# Patient Record
Sex: Female | Born: 1975 | Hispanic: Yes | Marital: Single
Health system: Midwestern US, Academic
[De-identification: ages and names within clinical notes are randomized; demographics above are authoritative.]

## PROBLEM LIST (undated history)

## (undated) ENCOUNTER — Inpatient Hospital Stay (HOSPITAL_COMMUNITY): Payer: Self-pay

## (undated) DIAGNOSIS — A150 Tuberculosis of lung: Secondary | ICD-10-CM

## (undated) DIAGNOSIS — O4403 Placenta previa specified as without hemorrhage, third trimester: ICD-10-CM

## (undated) HISTORY — PX: FACIAL COSMETIC SURGERY: SHX629

---

## 2005-02-01 ENCOUNTER — Emergency Department (HOSPITAL_COMMUNITY): Admission: EM | Admit: 2005-02-01 | Discharge: 2005-02-02 | Payer: Self-pay | Admitting: Emergency Medicine

## 2005-02-03 ENCOUNTER — Emergency Department (HOSPITAL_COMMUNITY): Admission: EM | Admit: 2005-02-03 | Discharge: 2005-02-03 | Payer: Self-pay | Admitting: Emergency Medicine

## 2006-02-11 ENCOUNTER — Emergency Department (HOSPITAL_COMMUNITY): Admission: EM | Admit: 2006-02-11 | Discharge: 2006-02-11 | Payer: Self-pay | Admitting: Emergency Medicine

## 2006-04-03 ENCOUNTER — Emergency Department (HOSPITAL_COMMUNITY): Admission: EM | Admit: 2006-04-03 | Discharge: 2006-04-03 | Payer: Self-pay | Admitting: Emergency Medicine

## 2006-11-30 ENCOUNTER — Inpatient Hospital Stay (HOSPITAL_COMMUNITY): Admission: AD | Admit: 2006-11-30 | Discharge: 2006-12-01 | Payer: Self-pay | Admitting: Obstetrics & Gynecology

## 2006-12-06 ENCOUNTER — Inpatient Hospital Stay (HOSPITAL_COMMUNITY): Admission: AD | Admit: 2006-12-06 | Discharge: 2006-12-06 | Payer: Self-pay | Admitting: Obstetrics and Gynecology

## 2010-03-14 ENCOUNTER — Emergency Department (HOSPITAL_COMMUNITY): Admission: EM | Admit: 2010-03-14 | Discharge: 2010-03-14 | Payer: Self-pay | Admitting: Emergency Medicine

## 2010-06-28 ENCOUNTER — Inpatient Hospital Stay (HOSPITAL_COMMUNITY): Admission: AD | Admit: 2010-06-28 | Discharge: 2010-06-28 | Payer: Self-pay | Admitting: Family Medicine

## 2010-06-28 ENCOUNTER — Ambulatory Visit: Payer: Self-pay | Admitting: Nurse Practitioner

## 2010-07-23 ENCOUNTER — Inpatient Hospital Stay (HOSPITAL_COMMUNITY): Admission: AD | Admit: 2010-07-23 | Discharge: 2010-07-23 | Payer: Self-pay | Admitting: Obstetrics & Gynecology

## 2010-08-08 ENCOUNTER — Inpatient Hospital Stay (HOSPITAL_COMMUNITY): Admission: AD | Admit: 2010-08-08 | Discharge: 2010-08-08 | Payer: Self-pay | Admitting: Obstetrics & Gynecology

## 2010-08-21 ENCOUNTER — Ambulatory Visit: Payer: Self-pay | Admitting: Obstetrics and Gynecology

## 2010-08-21 ENCOUNTER — Inpatient Hospital Stay (HOSPITAL_COMMUNITY): Admission: AD | Admit: 2010-08-21 | Discharge: 2010-08-21 | Payer: Self-pay | Admitting: Obstetrics and Gynecology

## 2010-09-10 ENCOUNTER — Inpatient Hospital Stay (HOSPITAL_COMMUNITY)
Admission: AD | Admit: 2010-09-10 | Discharge: 2010-09-10 | Payer: Self-pay | Source: Home / Self Care | Admitting: Obstetrics and Gynecology

## 2010-10-09 ENCOUNTER — Ambulatory Visit (HOSPITAL_COMMUNITY)
Admission: RE | Admit: 2010-10-09 | Discharge: 2010-10-09 | Payer: Self-pay | Source: Home / Self Care | Attending: Family Medicine | Admitting: Family Medicine

## 2010-11-13 ENCOUNTER — Ambulatory Visit
Admission: RE | Admit: 2010-11-13 | Discharge: 2010-11-13 | Payer: Self-pay | Source: Home / Self Care | Attending: Obstetrics and Gynecology | Admitting: Obstetrics and Gynecology

## 2010-11-13 LAB — POCT URINALYSIS DIPSTICK
Ketones, ur: NEGATIVE mg/dL
Specific Gravity, Urine: 1.02 (ref 1.005–1.030)
Urine Glucose, Fasting: NEGATIVE mg/dL
Urobilinogen, UA: 0.2 mg/dL (ref 0.0–1.0)

## 2010-11-27 ENCOUNTER — Other Ambulatory Visit: Payer: Self-pay

## 2010-11-27 ENCOUNTER — Encounter: Payer: Self-pay | Admitting: Family Medicine

## 2010-11-27 DIAGNOSIS — O09299 Supervision of pregnancy with other poor reproductive or obstetric history, unspecified trimester: Secondary | ICD-10-CM

## 2010-11-27 LAB — CONVERTED CEMR LAB
MCV: 86.9 fL (ref 78.0–100.0)
Platelets: 206 10*3/uL (ref 150–400)
RBC: 4.05 M/uL (ref 3.87–5.11)
WBC: 8 10*3/uL (ref 4.0–10.5)

## 2010-12-04 ENCOUNTER — Ambulatory Visit (HOSPITAL_COMMUNITY): Payer: Self-pay

## 2010-12-04 ENCOUNTER — Inpatient Hospital Stay (HOSPITAL_COMMUNITY): Payer: Medicaid Other

## 2010-12-04 ENCOUNTER — Inpatient Hospital Stay (HOSPITAL_COMMUNITY)
Admission: AD | Admit: 2010-12-04 | Discharge: 2010-12-06 | DRG: 778 | Disposition: A | Discharge status: Home / Self Care | Payer: Medicaid Other | Source: Ambulatory Visit | Attending: Obstetrics and Gynecology | Admitting: Obstetrics and Gynecology

## 2010-12-04 DIAGNOSIS — O4700 False labor before 37 completed weeks of gestation, unspecified trimester: Secondary | ICD-10-CM

## 2010-12-04 DIAGNOSIS — O26879 Cervical shortening, unspecified trimester: Secondary | ICD-10-CM | POA: Diagnosis present

## 2010-12-04 LAB — URINALYSIS, ROUTINE W REFLEX MICROSCOPIC
Bilirubin Urine: NEGATIVE
Ketones, ur: 15 mg/dL — AB
Nitrite: NEGATIVE
Protein, ur: NEGATIVE mg/dL
Specific Gravity, Urine: 1.025 (ref 1.005–1.030)
Urobilinogen, UA: 0.2 mg/dL (ref 0.0–1.0)

## 2010-12-04 LAB — WET PREP, GENITAL
Clue Cells Wet Prep HPF POC: NONE SEEN
Trich, Wet Prep: NONE SEEN

## 2010-12-04 LAB — URINE MICROSCOPIC-ADD ON

## 2010-12-05 ENCOUNTER — Encounter (HOSPITAL_COMMUNITY): Payer: Self-pay

## 2010-12-05 DIAGNOSIS — O4700 False labor before 37 completed weeks of gestation, unspecified trimester: Secondary | ICD-10-CM

## 2010-12-05 DIAGNOSIS — O26879 Cervical shortening, unspecified trimester: Secondary | ICD-10-CM

## 2010-12-05 LAB — CBC
HCT: 34.4 % — ABNORMAL LOW (ref 36.0–46.0)
Hemoglobin: 11.5 g/dL — ABNORMAL LOW (ref 12.0–15.0)
MCHC: 33.4 g/dL (ref 30.0–36.0)
RBC: 4 MIL/uL (ref 3.87–5.11)
WBC: 9 10*3/uL (ref 4.0–10.5)

## 2010-12-05 LAB — GC/CHLAMYDIA PROBE AMP, URINE
Chlamydia, Swab/Urine, PCR: NEGATIVE
GC Probe Amp, Urine: NEGATIVE

## 2010-12-05 LAB — FETAL FIBRONECTIN: Fetal Fibronectin: POSITIVE — AB

## 2010-12-06 DIAGNOSIS — O26879 Cervical shortening, unspecified trimester: Secondary | ICD-10-CM

## 2010-12-06 DIAGNOSIS — O4700 False labor before 37 completed weeks of gestation, unspecified trimester: Secondary | ICD-10-CM

## 2010-12-06 LAB — STREP B DNA PROBE: Strep Group B Ag: POSITIVE

## 2010-12-11 ENCOUNTER — Other Ambulatory Visit: Payer: Self-pay

## 2010-12-11 DIAGNOSIS — O0930 Supervision of pregnancy with insufficient antenatal care, unspecified trimester: Secondary | ICD-10-CM

## 2010-12-11 LAB — POCT URINALYSIS DIPSTICK
Protein, ur: NEGATIVE mg/dL
Urobilinogen, UA: 1 mg/dL (ref 0.0–1.0)
pH: 7 (ref 5.0–8.0)

## 2010-12-16 NOTE — Discharge Summary (Signed)
[name], [name]        ACCOUNT NO.:  [id number]  MEDICAL RECORD NO.:  [id number]           PATIENT TYPE:  I  LOCATION:  9159                          FACILITY:  [facility]  PHYSICIAN:  Horton Chin, MD DATE OF BIRTH:  04/18/1976  DATE OF ADMISSION:  12/04/2010 DATE OF DISCHARGE:  12/06/2010                              DISCHARGE SUMMARY   ADMISSION DIAGNOSIS:  Preterm labor at 29-2/7 weeks.  DISCHARGE DIAGNOSIS:  Preterm labor at 29-4/7 weeks.  CONSULTS:  Neonatology.  PERTINENT STUDIES:  On December 05, 2010, the patient had an limited OB ultrasound to assess cervical length which showed cervical length of 1 cm and a funnel width of 2.4 cm.  Fetus was known to be in a cephalic presentation with good cardiac activity at 138.  LABORATORY EVALUATION:  Showed a white blood cell count of 9, hemoglobin of 11.5, hematocrit 34.4, and platelet count of 180,000.  A fetal fibronectin that was done on admission was positive.  Urinalysis was negative for infection.  A wet prep was negative.  Gonorrhea, Chlamydia probe was also negative.  GBS was pending at the time of discharge.  The patient is also O+, antibody negative, hepatitis B surface antigen negative, rubella immune, HIV negative, RPR nonreactive.  Her due date is noted to be Feb 19, 2011 with a last menstrual period of May 13, 2010; however, on evaluation of her hollisters, her LMP was May 15, 2010 giving her [facility] as Feb 19, 2011.  That will make her 29 and 2 at the time of discharge.  BRIEF HOSPITAL COURSE:  The patient is a 35 year old gravida 6, para 3-1- 1-3 who was admitted for concern of preterm labor.  The patient was noted to be 1-2 cm, 50% effaced, -2 station and vertex.  She also had a positive fetal fibronectin of cervical length.  Ultrasound was done which showed 1 cm shortened cervix with 2.4 cm funnel length.  Given this concern, the patient was admitted to the antenatal unit.  She was given  betamethasone and also started on magnesium sulfate for tocolysis and also cerebral palsy protection.  The patient was on this medication for 12 hours at which point it was discontinued.  She did not require any further tocolysis as she did not have any contractions.  The patient was also started on Prometrium 200 mg per vagina at bedtime given her shortened cervix.  The patient was observed for 48 hours.  She had no further change in her cervical examination.  The patient's fetal heart rate tracing remained reactive and she had no other concerning symptoms. The patient was deemed stable for discharge to home.  Discharge medications included Prometrium 200 mg per vagina at bedtime. The patient is to continue her prenatal vitamins.  DISCHARGE INSTRUCTIONS:  The patient was given routine prenatal discharge instructions including preterm labor and fetal movement precautions.  She was told to be on bed rest with bathroom privileges. The patient will follow up in clinic for her already scheduled  appointment on February 23.  She was strongly advised to call the clinic or come back into the MAU if she has any concerning symptoms.  Of note, all the counseling was done with the help of a Spanish  interpreter.     Horton Chin, MD     UAA/MEDQ  D:  12/06/2010  T:  12/06/2010  Job:  [id number]  Electronically Signed by Jaynie Collins MD on 12/16/2010 06:19:09 PM

## 2010-12-18 ENCOUNTER — Other Ambulatory Visit: Payer: Self-pay

## 2010-12-18 DIAGNOSIS — O4700 False labor before 37 completed weeks of gestation, unspecified trimester: Secondary | ICD-10-CM

## 2010-12-18 LAB — POCT URINALYSIS DIPSTICK
Nitrite: NEGATIVE
Protein, ur: NEGATIVE mg/dL
Specific Gravity, Urine: >=1.03 (ref 1.005–1.030)
Urine Glucose, Fasting: NEGATIVE mg/dL
Urobilinogen, UA: 0.2 mg/dL (ref 0.0–1.0)

## 2010-12-25 DIAGNOSIS — O09299 Supervision of pregnancy with other poor reproductive or obstetric history, unspecified trimester: Secondary | ICD-10-CM

## 2010-12-25 DIAGNOSIS — O34219 Maternal care for unspecified type scar from previous cesarean delivery: Secondary | ICD-10-CM

## 2010-12-25 DIAGNOSIS — O99340 Other mental disorders complicating pregnancy, unspecified trimester: Secondary | ICD-10-CM

## 2010-12-25 DIAGNOSIS — O0930 Supervision of pregnancy with insufficient antenatal care, unspecified trimester: Secondary | ICD-10-CM

## 2010-12-30 LAB — URINALYSIS, ROUTINE W REFLEX MICROSCOPIC
Glucose, UA: NEGATIVE mg/dL
Ketones, ur: NEGATIVE mg/dL
Protein, ur: NEGATIVE mg/dL
Urobilinogen, UA: 0.2 mg/dL (ref 0.0–1.0)

## 2011-01-01 ENCOUNTER — Other Ambulatory Visit: Payer: Self-pay

## 2011-01-01 DIAGNOSIS — O09299 Supervision of pregnancy with other poor reproductive or obstetric history, unspecified trimester: Secondary | ICD-10-CM

## 2011-01-01 DIAGNOSIS — O34219 Maternal care for unspecified type scar from previous cesarean delivery: Secondary | ICD-10-CM

## 2011-01-01 DIAGNOSIS — O0930 Supervision of pregnancy with insufficient antenatal care, unspecified trimester: Secondary | ICD-10-CM

## 2011-01-01 DIAGNOSIS — O99340 Other mental disorders complicating pregnancy, unspecified trimester: Secondary | ICD-10-CM

## 2011-01-01 LAB — URINALYSIS, ROUTINE W REFLEX MICROSCOPIC
Glucose, UA: NEGATIVE mg/dL
Leukocytes, UA: NEGATIVE
Nitrite: NEGATIVE
Specific Gravity, Urine: 1.025 (ref 1.005–1.030)
Urobilinogen, UA: 0.2 mg/dL (ref 0.0–1.0)
pH: 6 (ref 5.0–8.0)
pH: 7 (ref 5.0–8.0)

## 2011-01-01 LAB — POCT URINALYSIS DIPSTICK
Nitrite: NEGATIVE
Protein, ur: NEGATIVE mg/dL
Urobilinogen, UA: 0.2 mg/dL (ref 0.0–1.0)
pH: 6 (ref 5.0–8.0)

## 2011-01-01 LAB — WET PREP, GENITAL
Trich, Wet Prep: NONE SEEN
Yeast Wet Prep HPF POC: NONE SEEN

## 2011-01-01 LAB — GC/CHLAMYDIA PROBE AMP, GENITAL: Chlamydia, DNA Probe: NEGATIVE

## 2011-01-01 LAB — CBC
MCH: 20.9 pg — ABNORMAL LOW (ref 26.0–34.0)
MCHC: 31.9 g/dL (ref 30.0–36.0)
Platelets: 289 10*3/uL (ref 150–400)

## 2011-01-01 LAB — POCT PREGNANCY, URINE: Preg Test, Ur: POSITIVE

## 2011-01-01 LAB — URINE MICROSCOPIC-ADD ON

## 2011-01-01 LAB — HCG, QUANTITATIVE, PREGNANCY: hCG, Beta Chain, Quant, S: 17071 m[IU]/mL — ABNORMAL HIGH (ref <5)

## 2011-01-05 LAB — POCT PREGNANCY, URINE: Preg Test, Ur: NEGATIVE

## 2011-01-08 ENCOUNTER — Other Ambulatory Visit: Payer: Self-pay | Admitting: Obstetrics & Gynecology

## 2011-01-08 DIAGNOSIS — O34219 Maternal care for unspecified type scar from previous cesarean delivery: Secondary | ICD-10-CM

## 2011-01-08 DIAGNOSIS — O09299 Supervision of pregnancy with other poor reproductive or obstetric history, unspecified trimester: Secondary | ICD-10-CM

## 2011-01-08 LAB — POCT URINALYSIS DIP (DEVICE)
Bilirubin Urine: NEGATIVE
Glucose, UA: NEGATIVE mg/dL
Ketones, ur: NEGATIVE mg/dL

## 2011-01-12 ENCOUNTER — Other Ambulatory Visit: Payer: Self-pay

## 2011-01-15 ENCOUNTER — Other Ambulatory Visit: Payer: Self-pay | Admitting: Family Medicine

## 2011-01-15 DIAGNOSIS — O26879 Cervical shortening, unspecified trimester: Secondary | ICD-10-CM

## 2011-01-15 DIAGNOSIS — O09299 Supervision of pregnancy with other poor reproductive or obstetric history, unspecified trimester: Secondary | ICD-10-CM

## 2011-01-15 LAB — POCT URINALYSIS DIP (DEVICE)
Bilirubin Urine: NEGATIVE
Glucose, UA: 250 mg/dL — AB
Specific Gravity, Urine: >=1.03 (ref 1.005–1.030)
Urobilinogen, UA: 0.2 mg/dL (ref 0.0–1.0)

## 2011-01-19 ENCOUNTER — Other Ambulatory Visit: Payer: Self-pay

## 2011-01-19 DIAGNOSIS — O09299 Supervision of pregnancy with other poor reproductive or obstetric history, unspecified trimester: Secondary | ICD-10-CM

## 2011-01-22 ENCOUNTER — Other Ambulatory Visit: Payer: Self-pay | Admitting: Obstetrics and Gynecology

## 2011-01-22 ENCOUNTER — Other Ambulatory Visit: Payer: Self-pay

## 2011-01-22 ENCOUNTER — Inpatient Hospital Stay (HOSPITAL_COMMUNITY)
Admission: AD | Admit: 2011-01-22 | Discharge: 2011-01-23 | Disposition: A | Discharge status: Home / Self Care | Payer: Self-pay | Source: Ambulatory Visit | Attending: Obstetrics & Gynecology | Admitting: Obstetrics & Gynecology

## 2011-01-22 DIAGNOSIS — O09299 Supervision of pregnancy with other poor reproductive or obstetric history, unspecified trimester: Secondary | ICD-10-CM

## 2011-01-22 DIAGNOSIS — O34219 Maternal care for unspecified type scar from previous cesarean delivery: Secondary | ICD-10-CM

## 2011-01-22 DIAGNOSIS — O479 False labor, unspecified: Secondary | ICD-10-CM | POA: Insufficient documentation

## 2011-01-22 LAB — POCT URINALYSIS DIP (DEVICE)
Bilirubin Urine: NEGATIVE
Glucose, UA: NEGATIVE mg/dL
Hgb urine dipstick: NEGATIVE
Nitrite: NEGATIVE
Specific Gravity, Urine: 1.025 (ref 1.005–1.030)

## 2011-01-26 ENCOUNTER — Other Ambulatory Visit: Payer: Self-pay

## 2011-01-29 ENCOUNTER — Other Ambulatory Visit: Payer: Self-pay

## 2011-01-29 ENCOUNTER — Other Ambulatory Visit: Payer: Self-pay | Admitting: Obstetrics & Gynecology

## 2011-01-29 DIAGNOSIS — O368190 Decreased fetal movements, unspecified trimester, not applicable or unspecified: Secondary | ICD-10-CM

## 2011-01-29 DIAGNOSIS — O09299 Supervision of pregnancy with other poor reproductive or obstetric history, unspecified trimester: Secondary | ICD-10-CM

## 2011-01-29 LAB — POCT URINALYSIS DIP (DEVICE): pH: 6.5 (ref 5.0–8.0)

## 2011-02-01 ENCOUNTER — Inpatient Hospital Stay (HOSPITAL_COMMUNITY)
Admission: AD | Admit: 2011-02-01 | Discharge: 2011-02-01 | Disposition: A | Discharge status: Home / Self Care | Payer: Medicaid Other | Source: Ambulatory Visit | Attending: Obstetrics & Gynecology | Admitting: Obstetrics & Gynecology

## 2011-02-01 DIAGNOSIS — O479 False labor, unspecified: Secondary | ICD-10-CM | POA: Insufficient documentation

## 2011-02-02 ENCOUNTER — Ambulatory Visit (HOSPITAL_COMMUNITY)
Admission: RE | Admit: 2011-02-02 | Discharge: 2011-02-02 | Disposition: A | Discharge status: Home / Self Care | Payer: Medicaid Other | Source: Ambulatory Visit | Attending: Obstetrics & Gynecology | Admitting: Obstetrics & Gynecology

## 2011-02-02 ENCOUNTER — Other Ambulatory Visit: Payer: Self-pay

## 2011-02-02 ENCOUNTER — Other Ambulatory Visit: Payer: Self-pay | Admitting: Obstetrics and Gynecology

## 2011-02-02 DIAGNOSIS — O99810 Abnormal glucose complicating pregnancy: Secondary | ICD-10-CM | POA: Insufficient documentation

## 2011-02-02 DIAGNOSIS — O368190 Decreased fetal movements, unspecified trimester, not applicable or unspecified: Secondary | ICD-10-CM

## 2011-02-02 DIAGNOSIS — O34219 Maternal care for unspecified type scar from previous cesarean delivery: Secondary | ICD-10-CM | POA: Insufficient documentation

## 2011-02-02 DIAGNOSIS — O09299 Supervision of pregnancy with other poor reproductive or obstetric history, unspecified trimester: Secondary | ICD-10-CM | POA: Insufficient documentation

## 2011-02-02 DIAGNOSIS — O0930 Supervision of pregnancy with insufficient antenatal care, unspecified trimester: Secondary | ICD-10-CM

## 2011-02-02 LAB — POCT URINALYSIS DIP (DEVICE)
Protein, ur: 30 mg/dL — AB
Specific Gravity, Urine: 1.02 (ref 1.005–1.030)

## 2011-02-05 ENCOUNTER — Ambulatory Visit: Payer: Self-pay

## 2011-02-05 ENCOUNTER — Other Ambulatory Visit: Payer: Self-pay | Admitting: Obstetrics & Gynecology

## 2011-02-05 ENCOUNTER — Inpatient Hospital Stay (HOSPITAL_COMMUNITY)
Admission: AD | Admit: 2011-02-05 | Discharge: 2011-02-05 | Disposition: A | Discharge status: Home / Self Care | Payer: Self-pay | Source: Ambulatory Visit | Attending: Obstetrics & Gynecology | Admitting: Obstetrics & Gynecology

## 2011-02-05 ENCOUNTER — Other Ambulatory Visit: Payer: Self-pay

## 2011-02-05 DIAGNOSIS — O0930 Supervision of pregnancy with insufficient antenatal care, unspecified trimester: Secondary | ICD-10-CM

## 2011-02-05 DIAGNOSIS — O368190 Decreased fetal movements, unspecified trimester, not applicable or unspecified: Secondary | ICD-10-CM | POA: Insufficient documentation

## 2011-02-05 DIAGNOSIS — O34219 Maternal care for unspecified type scar from previous cesarean delivery: Secondary | ICD-10-CM

## 2011-02-05 DIAGNOSIS — O09299 Supervision of pregnancy with other poor reproductive or obstetric history, unspecified trimester: Secondary | ICD-10-CM

## 2011-02-05 DIAGNOSIS — Z331 Pregnant state, incidental: Secondary | ICD-10-CM

## 2011-02-05 DIAGNOSIS — O99340 Other mental disorders complicating pregnancy, unspecified trimester: Secondary | ICD-10-CM

## 2011-02-05 LAB — POCT URINALYSIS DIP (DEVICE)
Bilirubin Urine: NEGATIVE
Glucose, UA: NEGATIVE mg/dL
Hgb urine dipstick: NEGATIVE
Ketones, ur: NEGATIVE mg/dL
Specific Gravity, Urine: >=1.03 (ref 1.005–1.030)
Urobilinogen, UA: 0.2 mg/dL (ref 0.0–1.0)

## 2011-02-09 ENCOUNTER — Other Ambulatory Visit: Payer: Self-pay

## 2011-02-09 DIAGNOSIS — O09299 Supervision of pregnancy with other poor reproductive or obstetric history, unspecified trimester: Secondary | ICD-10-CM

## 2011-02-09 DIAGNOSIS — O34219 Maternal care for unspecified type scar from previous cesarean delivery: Secondary | ICD-10-CM

## 2011-02-09 DIAGNOSIS — Z331 Pregnant state, incidental: Secondary | ICD-10-CM

## 2011-02-12 ENCOUNTER — Inpatient Hospital Stay (HOSPITAL_COMMUNITY)
Admission: RE | Admit: 2011-02-12 | Discharge: 2011-02-14 | DRG: 775 | Disposition: A | Discharge status: Home / Self Care | Payer: Medicaid Other | Source: Ambulatory Visit | Attending: Obstetrics & Gynecology | Admitting: Obstetrics & Gynecology

## 2011-02-12 DIAGNOSIS — O99892 Other specified diseases and conditions complicating childbirth: Secondary | ICD-10-CM | POA: Diagnosis present

## 2011-02-12 DIAGNOSIS — O9989 Other specified diseases and conditions complicating pregnancy, childbirth and the puerperium: Secondary | ICD-10-CM

## 2011-02-12 DIAGNOSIS — Z22330 Carrier of Group B streptococcus: Secondary | ICD-10-CM

## 2011-02-12 DIAGNOSIS — O34219 Maternal care for unspecified type scar from previous cesarean delivery: Secondary | ICD-10-CM

## 2011-02-12 LAB — CBC
HCT: 36.2 % (ref 36.0–46.0)
Hemoglobin: 12 g/dL (ref 12.0–15.0)
MCH: 29 pg (ref 26.0–34.0)
MCV: 87.4 fL (ref 78.0–100.0)
RBC: 4.14 MIL/uL (ref 3.87–5.11)

## 2011-02-13 LAB — CBC
HCT: 25.4 % — ABNORMAL LOW (ref 36.0–46.0)
Hemoglobin: 8.5 g/dL — ABNORMAL LOW (ref 12.0–15.0)
MCH: 29.7 pg (ref 26.0–34.0)
MCHC: 33.5 g/dL (ref 30.0–36.0)

## 2011-03-12 ENCOUNTER — Ambulatory Visit: Payer: Self-pay | Admitting: Family Medicine

## 2011-04-02 ENCOUNTER — Ambulatory Visit: Payer: Medicaid Other | Admitting: Obstetrics and Gynecology

## 2011-04-09 ENCOUNTER — Emergency Department (HOSPITAL_COMMUNITY)
Admission: EM | Admit: 2011-04-09 | Discharge: 2011-04-09 | Disposition: A | Discharge status: Home / Self Care | Payer: Self-pay | Attending: Emergency Medicine | Admitting: Emergency Medicine

## 2011-04-09 DIAGNOSIS — M779 Enthesopathy, unspecified: Secondary | ICD-10-CM | POA: Insufficient documentation

## 2011-04-09 DIAGNOSIS — M79609 Pain in unspecified limb: Secondary | ICD-10-CM | POA: Insufficient documentation

## 2011-04-24 IMAGING — US US OB TRANSVAGINAL
1 series · 14 of 23 positions shown · non-contrast
Comparison: none

OBSTETRICAL ULTRASOUND:
 This ultrasound exam was performed in the [HOSPITAL] Ultrasound Department.  The OB US report was generated in the AS system, and faxed to the ordering physician.  This report is also available in [HOSPITAL]?s AccessANYware and in [REDACTED] PACS.

[Series 1: us ob transvaginal · 0.14mm/px · 14 of 23 slices shown]
[im 1/23]
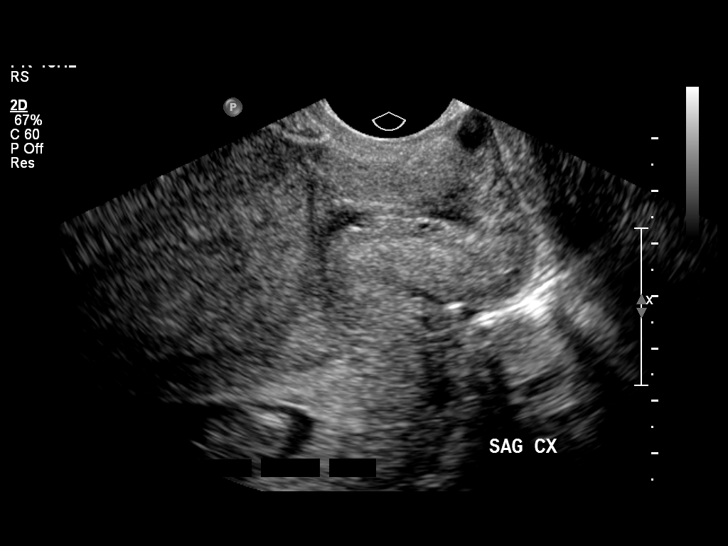
[im 3/23]
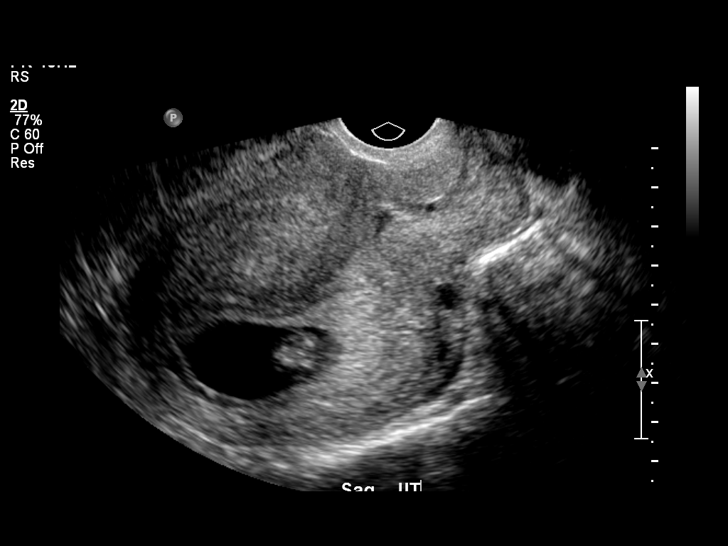
[im 5/23]
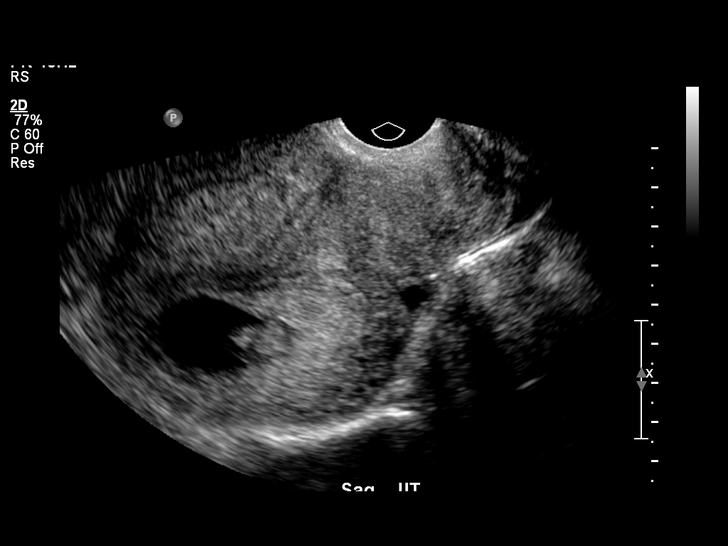
[im 6/23]
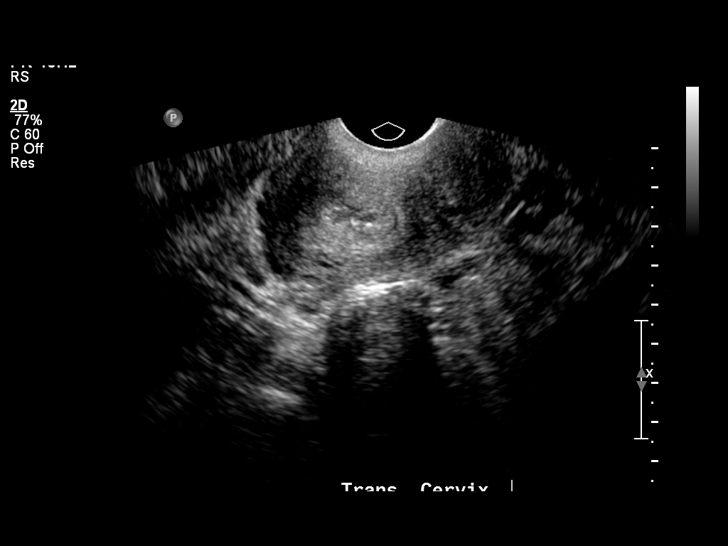
[im 8/23]
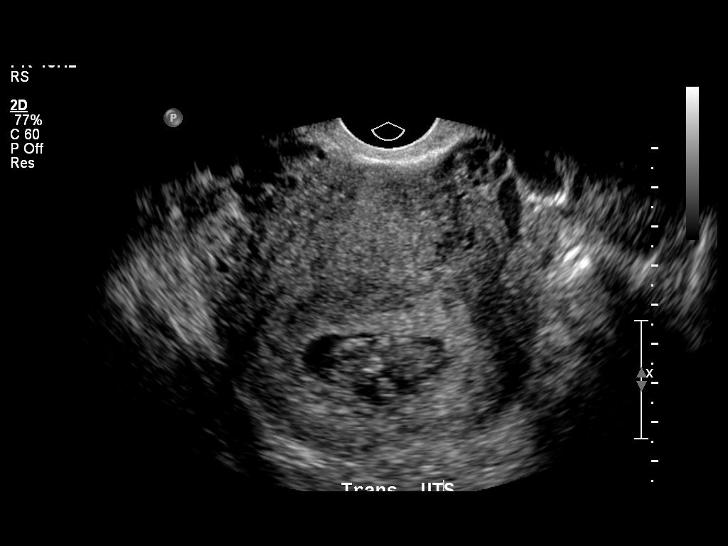
[im 10/23]
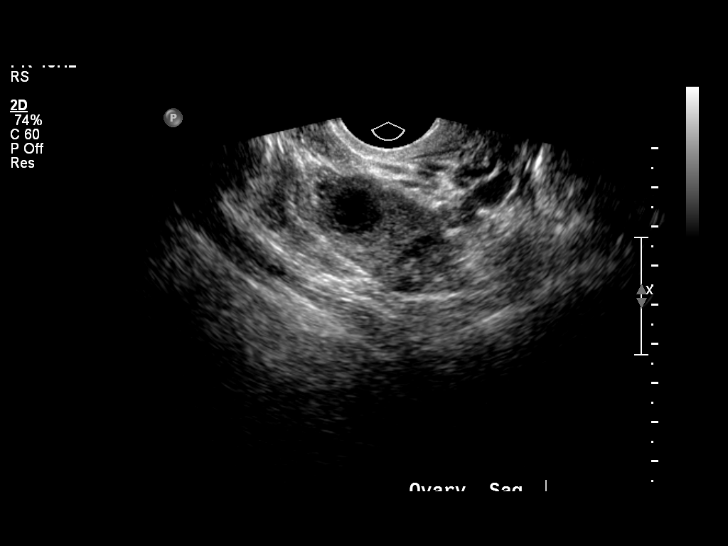
[im 11/23]
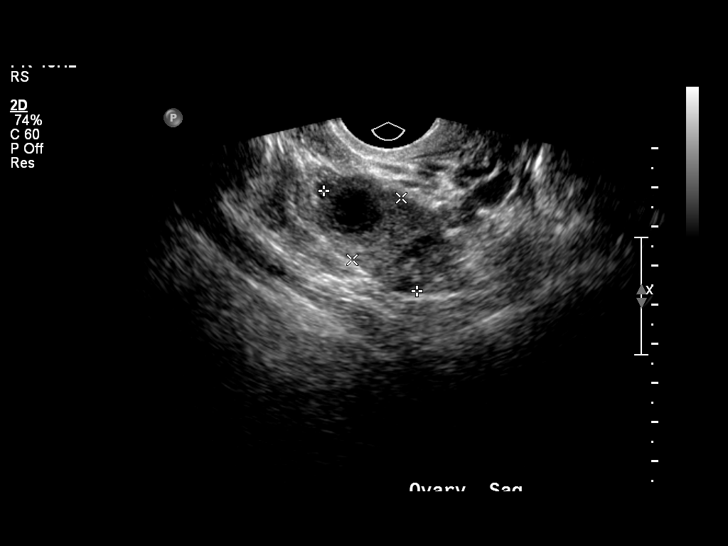
[im 13/23]
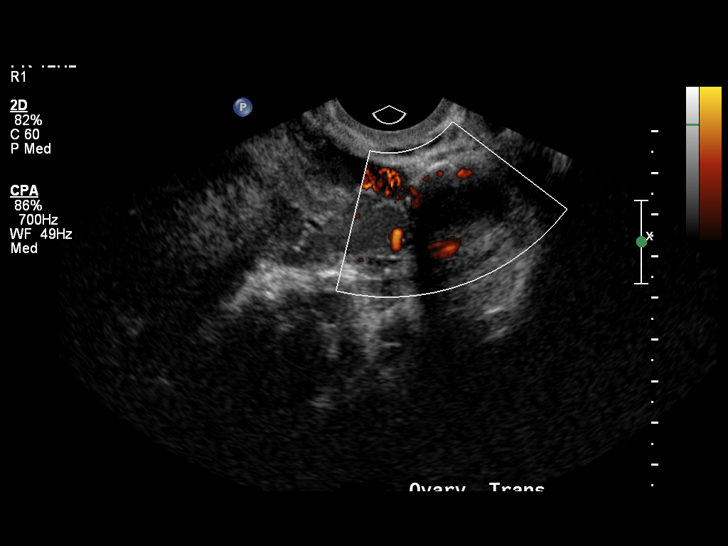
[im 14/23]
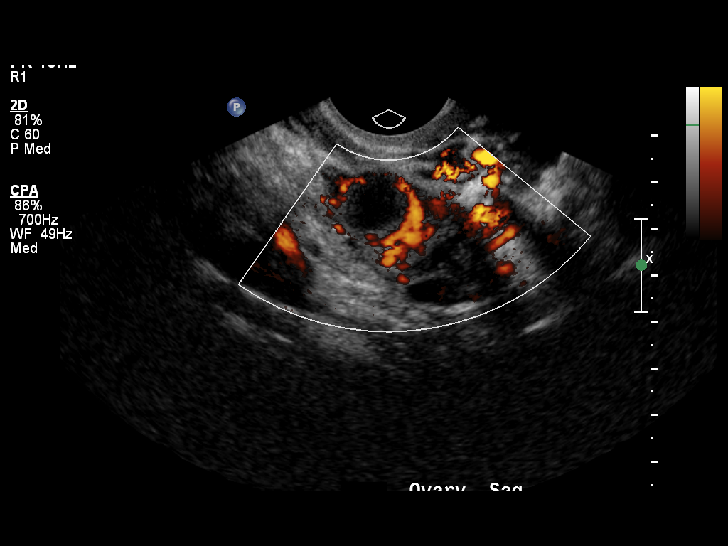
[im 16/23]
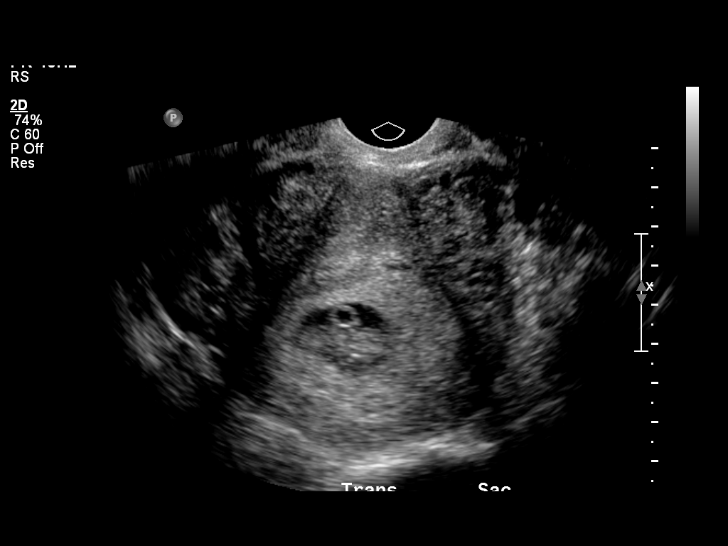
[im 18/23]
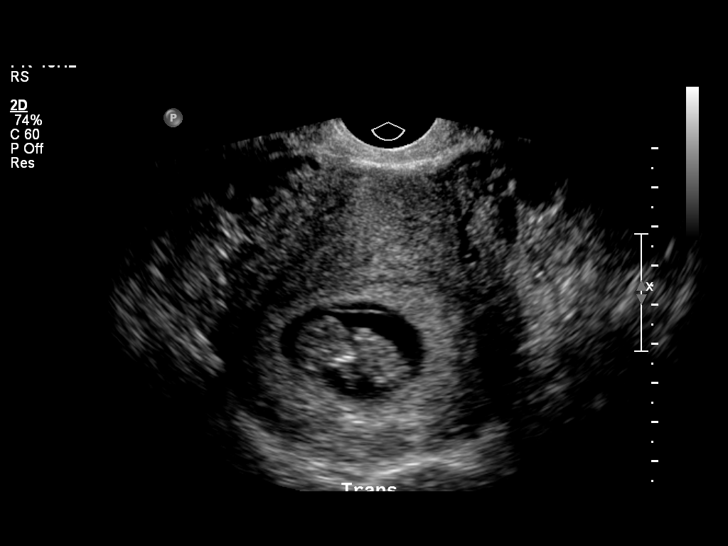
[im 19/23]
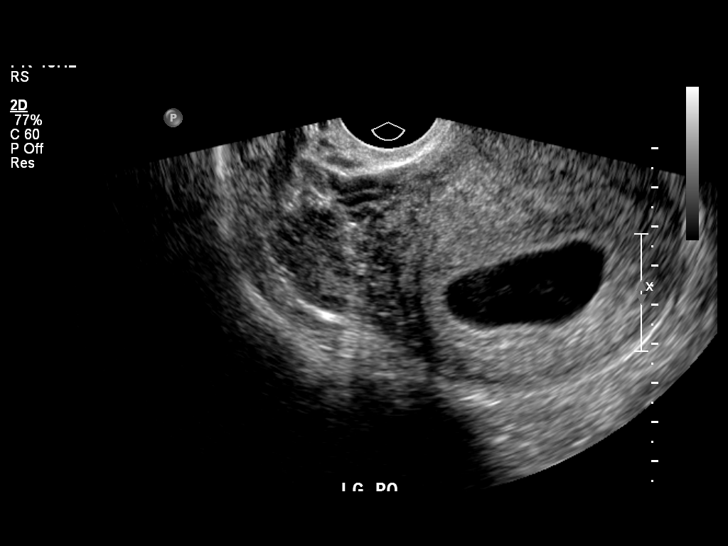
[im 21/23]
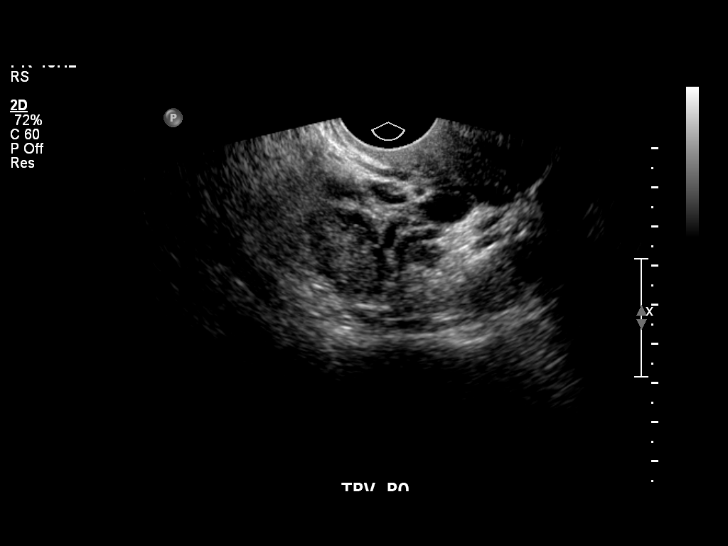
[im 23/23]
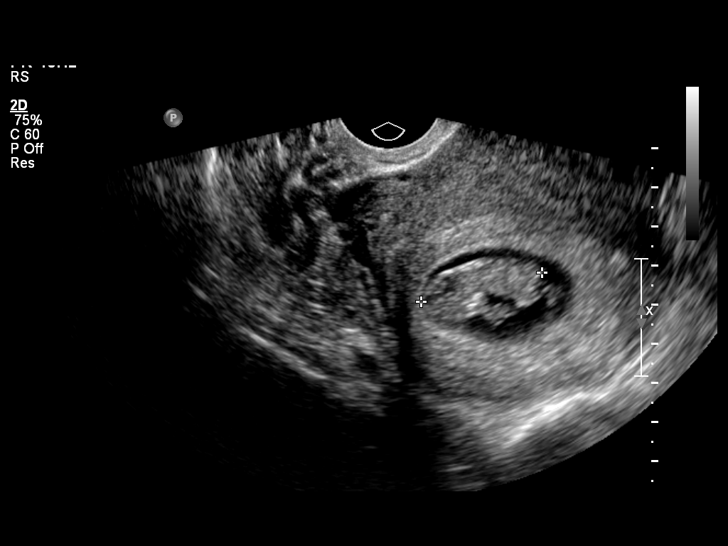

[14 of 23 positions shown; findings below may reference images not displayed]

IMPRESSION: See AS Obstetric US report.

## 2011-08-04 ENCOUNTER — Encounter (HOSPITAL_COMMUNITY): Payer: Self-pay | Admitting: *Deleted

## 2011-10-20 NOTE — L&D Delivery Note (Signed)
Delivery Note At 10:46 AM a viable and healthy female was delivered via  (Presentation: left occiput anterior) with nuchal cord x 1.  APGAR: 9, 9; weight: pending.   Placenta status: in tact.  Cord:  with the following complications: none.  Anesthesia: None  Episiotomy: None  Lacerations: None Suture Repair: n/a Est. Blood Loss (mL): 300  Mom to postpartum.  Baby to nursery-stable.  Simone Curia 07/08/2012, 11:03 AM

## 2011-10-20 NOTE — L&D Delivery Note (Signed)
I was present for entire delivery. Agree with above. Tamica Covell, MD 

## 2011-11-21 ENCOUNTER — Inpatient Hospital Stay (HOSPITAL_COMMUNITY)
Admission: AD | Admit: 2011-11-21 | Discharge: 2011-11-22 | Disposition: A | Discharge status: Home / Self Care | Payer: Self-pay | Source: Ambulatory Visit | Attending: Family Medicine | Admitting: Family Medicine

## 2011-11-21 ENCOUNTER — Encounter (HOSPITAL_COMMUNITY): Payer: Self-pay

## 2011-11-21 DIAGNOSIS — D649 Anemia, unspecified: Secondary | ICD-10-CM | POA: Insufficient documentation

## 2011-11-21 DIAGNOSIS — O99019 Anemia complicating pregnancy, unspecified trimester: Secondary | ICD-10-CM | POA: Insufficient documentation

## 2011-11-21 DIAGNOSIS — O209 Hemorrhage in early pregnancy, unspecified: Secondary | ICD-10-CM | POA: Insufficient documentation

## 2011-11-21 DIAGNOSIS — R1900 Intra-abdominal and pelvic swelling, mass and lump, unspecified site: Secondary | ICD-10-CM | POA: Diagnosis present

## 2011-11-21 DIAGNOSIS — N83209 Unspecified ovarian cyst, unspecified side: Secondary | ICD-10-CM

## 2011-11-21 DIAGNOSIS — O4690 Antepartum hemorrhage, unspecified, unspecified trimester: Secondary | ICD-10-CM

## 2011-11-21 LAB — URINALYSIS, ROUTINE W REFLEX MICROSCOPIC
Glucose, UA: NEGATIVE mg/dL
Ketones, ur: 15 mg/dL — AB
Protein, ur: NEGATIVE mg/dL
pH: 6.5 (ref 5.0–8.0)

## 2011-11-21 LAB — URINE MICROSCOPIC-ADD ON

## 2011-11-21 NOTE — Progress Notes (Signed)
Patient is in with sudden onset on bright red vaginal bleeding that started ago. She c/o rlq pain 4/10. She denies feeling dizziness.

## 2011-11-21 NOTE — Progress Notes (Signed)
Pt states, " I started bleeding twenty minutes ago. I was sitting down and when I stood up I felt a gush of blood. I have just now started cramping."

## 2011-11-21 NOTE — ED Provider Notes (Signed)
History     Chief Complaint  Patient presents with  . Vaginal Bleeding  . Abdominal Cramping   HPI  Patient is in with sudden onset on bright red vaginal bleeding that started ago. She c/o rlq pain 4/10 prior to arrival, but none at this time. She denies feeling dizzy, nausea, or vomiting.  Also, no reports of fever, body aches or chills.     History reviewed. No pertinent past medical history.  Past Surgical History  Procedure Date  . Cesarean section     History reviewed. No pertinent family history.  History  Substance Use Topics  . Smoking status: Never Smoker   . Smokeless tobacco: Not on file  . Alcohol Use: No    Allergies: Allergies not on file  No prescriptions prior to admission    Review of Systems  Constitutional: Negative.   Gastrointestinal: Positive for abdominal pain (lower pelvic pain).  Genitourinary:       Vaginal bleeding  Neurological: Positive for dizziness. Negative for tingling, sensory change, speech change and loss of consciousness.  All other systems reviewed and are negative.   Physical Exam   Blood pressure 125/62, pulse 95, temperature 98.7 F (37.1 C), temperature source Oral, resp. rate 20, height 4' 10.5" (1.486 m), weight 68.947 kg (152 lb), last menstrual period 10/12/2011, unknown if currently breastfeeding.  Physical Exam  Constitutional: She is oriented to person, place, and time. She appears well-developed and well-nourished. No distress.  HENT:  Head: Normocephalic.  Neck: Normal range of motion. Neck supple.  Cardiovascular: Normal rate, regular rhythm and normal heart sounds.   Respiratory: Effort normal and breath sounds normal.  GI: Soft. She exhibits no mass. There is tenderness (midpelvic). There is no guarding.  Genitourinary: Cervix exhibits discharge ( bleeding; polyp-like lesions protruding from cervical os). Cervix exhibits no motion tenderness. There is bleeding (negative clots) around the vagina.    Neurological: She is alert and oriented to person, place, and time. She has normal reflexes.  Skin: Skin is warm and dry.    MAU Course  Procedures Results for orders placed during the hospital encounter of 11/21/11 (from the past 24 hour(s))  URINALYSIS, ROUTINE W REFLEX MICROSCOPIC     Status: Abnormal   Collection Time   11/21/11 11:35 PM      Component Value Range   Color, Urine YELLOW  YELLOW    APPearance CLEAR  CLEAR    Specific Gravity, Urine 1.015  1.005 - 1.030    pH 6.5  5.0 - 8.0    Glucose, UA NEGATIVE  NEGATIVE (mg/dL)   Hgb urine dipstick LARGE (*) NEGATIVE    Bilirubin Urine NEGATIVE  NEGATIVE    Ketones, ur 15 (*) NEGATIVE (mg/dL)   Protein, ur NEGATIVE  NEGATIVE (mg/dL)   Urobilinogen, UA 0.2  0.0 - 1.0 (mg/dL)   Nitrite NEGATIVE  NEGATIVE    Leukocytes, UA NEGATIVE  NEGATIVE   URINE MICROSCOPIC-ADD ON     Status: Abnormal   Collection Time   11/21/11 11:35 PM      Component Value Range   Squamous Epithelial / LPF RARE  RARE    WBC, UA 0-2  <3 (WBC/hpf)   RBC / HPF 11-20  <3 (RBC/hpf)   Bacteria, UA FEW (*) RARE   WET PREP, GENITAL     Status: Abnormal   Collection Time   11/22/11 12:15 AM      Component Value Range   Yeast Wet Prep HPF POC NONE SEEN  NONE SEEN    Trich, Wet Prep NONE SEEN  NONE SEEN    Clue Cells Wet Prep HPF POC RARE (*) NONE SEEN    WBC, Wet Prep HPF POC FEW (*) NONE SEEN   CBC     Status: Abnormal   Collection Time   11/22/11 12:22 AM      Component Value Range   WBC 7.7  4.0 - 10.5 (K/uL)   RBC 3.96  3.87 - 5.11 (MIL/uL)   Hemoglobin 7.8 (*) 12.0 - 15.0 (g/dL)   HCT 16.1 (*) 09.6 - 46.0 (%)   MCV 65.7 (*) 78.0 - 100.0 (fL)   MCH 19.7 (*) 26.0 - 34.0 (pg)   MCHC 30.0  30.0 - 36.0 (g/dL)   RDW 04.5 (*) 40.9 - 15.5 (%)   Platelets 344  150 - 400 (K/uL)  HCG, QUANTITATIVE, PREGNANCY     Status: Abnormal   Collection Time   11/22/11 12:22 AM      Component Value Range   hCG, Beta Chain, Quant, S 3753 (*) <5 (mIU/mL)  POCT PREGNANCY,  URINE     Status: Abnormal   Collection Time   11/22/11  1:03 AM      Component Value Range   Preg Test, Ur POSITIVE (*) NEGATIVE     Ultrasound: IMPRESSION:  Single intrauterine gestational sac identified. No yolk sac or  embryo at this time. Estimated age by mean sac diameter is 5 weeks  1 day. Recommend correlation with serial beta HCGs.  6.3 x 4.3 cm soft tissue echogenicity posterior and superior to the  uterine fundus. This is nonspecific and may simply represent a  segment of colon. However, no peristalisis was visualized.  Recommend a short-term ultrasound follow-up (1-2 weeks) to document  resolution. If a mass persists, MRI would be recommended.  Discussed via telephone with Sid Falcon at 01:45 a.m. on  11/21/2010.   Assessment and Plan  Anemia Bleeding During Pregnancy - Cervical Polyps Pelvic Mass  Plan: DC home F/U in 2 days for BHCG and 7-10 days for repeat ultrasound to assess mass. Seen lesions from cervix to pathology for eval. Bleeding precautions   [facility] 11/21/2011, 11:38 PM

## 2011-11-22 ENCOUNTER — Other Ambulatory Visit: Payer: Self-pay | Admitting: Family Medicine

## 2011-11-22 ENCOUNTER — Ambulatory Visit (HOSPITAL_COMMUNITY): Payer: Self-pay

## 2011-11-22 ENCOUNTER — Inpatient Hospital Stay (HOSPITAL_COMMUNITY): Payer: Self-pay

## 2011-11-22 DIAGNOSIS — R109 Unspecified abdominal pain: Secondary | ICD-10-CM

## 2011-11-22 DIAGNOSIS — R1900 Intra-abdominal and pelvic swelling, mass and lump, unspecified site: Secondary | ICD-10-CM | POA: Diagnosis present

## 2011-11-22 DIAGNOSIS — O26899 Other specified pregnancy related conditions, unspecified trimester: Secondary | ICD-10-CM

## 2011-11-22 LAB — CBC
HCT: 26 % — ABNORMAL LOW (ref 36.0–46.0)
MCH: 19.7 pg — ABNORMAL LOW (ref 26.0–34.0)
MCV: 65.7 fL — ABNORMAL LOW (ref 78.0–100.0)
Platelets: 344 10*3/uL (ref 150–400)
RBC: 3.96 MIL/uL (ref 3.87–5.11)

## 2011-11-22 LAB — WET PREP, GENITAL: Yeast Wet Prep HPF POC: NONE SEEN

## 2011-11-22 LAB — POCT PREGNANCY, URINE: Preg Test, Ur: POSITIVE — AB

## 2011-11-22 MED ORDER — INTEGRA F 125-1 MG PO CAPS: 1.0000 | ORAL_CAPSULE | Freq: Every day | ORAL | Status: DC

## 2011-11-22 NOTE — ED Provider Notes (Signed)
Chart reviewed and agree with management and plan.  

## 2011-11-24 ENCOUNTER — Inpatient Hospital Stay (HOSPITAL_COMMUNITY)
Admission: AD | Admit: 2011-11-24 | Discharge: 2011-11-24 | Disposition: A | Discharge status: Home / Self Care | Payer: Self-pay | Source: Ambulatory Visit | Attending: Obstetrics & Gynecology | Admitting: Obstetrics & Gynecology

## 2011-11-24 ENCOUNTER — Encounter (HOSPITAL_COMMUNITY): Payer: Self-pay | Admitting: *Deleted

## 2011-11-24 DIAGNOSIS — O209 Hemorrhage in early pregnancy, unspecified: Secondary | ICD-10-CM | POA: Insufficient documentation

## 2011-11-24 DIAGNOSIS — M549 Dorsalgia, unspecified: Secondary | ICD-10-CM | POA: Insufficient documentation

## 2011-11-24 DIAGNOSIS — O26899 Other specified pregnancy related conditions, unspecified trimester: Secondary | ICD-10-CM

## 2011-11-24 DIAGNOSIS — R109 Unspecified abdominal pain: Secondary | ICD-10-CM | POA: Insufficient documentation

## 2011-11-24 LAB — HCG, QUANTITATIVE, PREGNANCY: hCG, Beta Chain, Quant, S: 7849 m[IU]/mL — ABNORMAL HIGH (ref <5)

## 2011-11-24 NOTE — Progress Notes (Signed)
Pt states, " I am here for repeat blood work because I had bleeding on 2/3 and I thought I was having a miscarriage."

## 2011-11-24 NOTE — ED Provider Notes (Signed)
History     No chief complaint on file.  HPI  [name] is 36 y.o. [id number] 109w1d weeks presents for repeat BHCG.  SHe was initially seen 2/2  with complaints of sudden onset of vaginal bleeding and abdominal pain-right lower quadrant.  +UPT, BHCG 3752, blood type from previous record is O+, Ultrasound findings of single IUGS, no YS or embryo measuring [redacted]w[redacted]d.  Denies bleeding today.  Reports mild cramping in her lower abdomen and lower back.  Cultures taken 2/3 were negative.  No past medical history on file.  Past Surgical History  Procedure Date  . Cesarean section     No family history on file.  History  Substance Use Topics  . Smoking status: Never Smoker   . Smokeless tobacco: Not on file  . Alcohol Use: No    Allergies: No Known Allergies  Prescriptions prior to admission  Medication Sig Dispense Refill  . Fe Fum-FePoly-FA-Vit C-Vit B3 (INTEGRA F) 125-1 MG CAPS Take 1 tablet by mouth daily.  30 capsule  4    Review of Systems  Constitutional: Negative.   Gastrointestinal: Positive for abdominal pain (cramping-mild). Negative for nausea and vomiting.  Genitourinary:       Negative for vaginal bleeding  Musculoskeletal: Positive for back pain (mild lower back pain).   Physical Exam   Last menstrual period 10/12/2011, unknown if currently breastfeeding.  Physical Exam  Constitutional: She is oriented to person, place, and time. She appears well-developed and well-nourished. No distress.  HENT:  Head: Normocephalic.  Neck: Normal range of motion.  Respiratory: Effort normal.  Genitourinary:       Not indicated  Neurological: She is alert and oriented to person, place, and time.  Skin: Skin is warm and dry.  Psychiatric: Her behavior is normal.      Results for orders placed during the hospital encounter of 11/24/11 (from the past 24 hour(s))  HCG, QUANTITATIVE, PREGNANCY     Status: Abnormal   Collection Time   11/24/11  5:49 PM      Component Value  Range   hCG, Beta Chain, Quant, S 7849 (*) <5 (mIU/mL)   MAU Course  Procedures    PATHOLOGY REPORT:  Hypersecretory endometrium with decidua.  No evidence of chorionic villi, implantation site or fetal parts.  MDM  Results explained to patient through interpreter.  Given opportunity to ask questions   Assessment and Plan  A:  Lower abdominal and back pain in early pregnancy      BHCGs doubled   P:  Return for repeat ultrasound for viability in 1 week      Return sooner for increased pain or heavy vaginal bleeding P:  Return for repeat Ultrasound in 1 week      Return before that date for increased bleeding or severe pain.  KEY,EVE M 11/24/2011, 6:22 PM   Matt Holmes, NP 11/24/11 1942

## 2011-11-24 NOTE — ED Notes (Signed)
Eve Key RNP at bedside discussing results of labs with plan to return next Wed for [place]. Translator in with all discussion.

## 2011-12-02 ENCOUNTER — Ambulatory Visit (HOSPITAL_COMMUNITY)
Admission: RE | Admit: 2011-12-02 | Discharge: 2011-12-02 | Disposition: A | Discharge status: Home / Self Care | Payer: Self-pay | Source: Ambulatory Visit | Attending: Gynecology | Admitting: Gynecology

## 2011-12-02 ENCOUNTER — Inpatient Hospital Stay (HOSPITAL_COMMUNITY)
Admission: AD | Admit: 2011-12-02 | Discharge: 2011-12-02 | Disposition: A | Discharge status: Home / Self Care | Payer: Self-pay | Source: Ambulatory Visit | Attending: Obstetrics and Gynecology | Admitting: Obstetrics and Gynecology

## 2011-12-02 ENCOUNTER — Other Ambulatory Visit (HOSPITAL_COMMUNITY): Payer: Self-pay | Admitting: *Deleted

## 2011-12-02 DIAGNOSIS — Z3689 Encounter for other specified antenatal screening: Secondary | ICD-10-CM | POA: Insufficient documentation

## 2011-12-02 DIAGNOSIS — O99891 Other specified diseases and conditions complicating pregnancy: Secondary | ICD-10-CM | POA: Insufficient documentation

## 2011-12-02 NOTE — Progress Notes (Signed)
Patient to MAU after follow up ultrasound to confirm viability. Patient denies any pain or bleeding at this time. Pacific translator used for triage.

## 2011-12-28 ENCOUNTER — Encounter (HOSPITAL_COMMUNITY): Payer: Self-pay | Admitting: *Deleted

## 2011-12-28 ENCOUNTER — Inpatient Hospital Stay (HOSPITAL_COMMUNITY)
Admission: AD | Admit: 2011-12-28 | Discharge: 2011-12-28 | Disposition: A | Discharge status: Home / Self Care | Payer: Self-pay | Source: Ambulatory Visit | Attending: Obstetrics & Gynecology | Admitting: Obstetrics & Gynecology

## 2011-12-28 DIAGNOSIS — O2240 Hemorrhoids in pregnancy, unspecified trimester: Secondary | ICD-10-CM

## 2011-12-28 DIAGNOSIS — K644 Residual hemorrhoidal skin tags: Secondary | ICD-10-CM | POA: Insufficient documentation

## 2011-12-28 DIAGNOSIS — O228X9 Other venous complications in pregnancy, unspecified trimester: Secondary | ICD-10-CM | POA: Insufficient documentation

## 2011-12-28 LAB — URINALYSIS, ROUTINE W REFLEX MICROSCOPIC
Bilirubin Urine: NEGATIVE
Leukocytes, UA: NEGATIVE
Nitrite: NEGATIVE
Specific Gravity, Urine: >1.03 — ABNORMAL HIGH (ref 1.005–1.030)
Urobilinogen, UA: 0.2 mg/dL (ref 0.0–1.0)
pH: 5.5 (ref 5.0–8.0)

## 2011-12-28 MED ORDER — HYDROCORTISONE ACE-PRAMOXINE 1-1 % RE FOAM: 1.0000 | Freq: Two times a day (BID) | RECTAL | Status: AC

## 2011-12-28 MED ORDER — DOCUSATE SODIUM 100 MG PO CAPS: 100.0000 mg | ORAL_CAPSULE | Freq: Two times a day (BID) | ORAL | Status: AC

## 2011-12-28 NOTE — MAU Note (Signed)
W. Muhammad, CNM at bedside.  Assessment done and poc discussed with pt.   

## 2011-12-28 NOTE — Discharge Instructions (Signed)
Hemorroides  (Hemorrhoids) Las hemorroides son venas agrandadas (dilatadas) alrededor del recto. Hay 2 tipos de hemorroides, que se determina por su ubicacin. Las hemorroides internas, que son las que se encuentran dentro del recto.Generalmente no duelen, Charity fundraiser.Sin embargo, pueden hincharse y salir por el recto, entonces se irritan y duelen. Las hemorroides externas incluyen las venas externas del ano, y se sienten como un bulto duro y que duele, cerca del ano.Muchas veces pican, y pueden romperse y Geophysicist/field seismologist. En algunos casos se forman cogulos en las venas. Esto hace que se hinchen y duelan. Esto se suele denominar trombosis hemorroidal.  CAUSAS  Las causas de hemorroides son:   Vanetta Mulders. El embarazo aumenta la presin en las venas hemorroidales.   Constipacin   Dificultad para mover el intestino   Obesidad.   Levantar pesas u otras actividades que impliquen esfuerzo.  TRATAMIENTO  La mayora de los casos de hemorroides mejoran en 1 a 2 semanas. Sin embargo, si los sntomas no mejoran o tiene Runner, broadcasting/film/video, el mdico puede Education officer, environmental un procedimiento para disminuir las hemorroides o extirparlas completamente.Los tratamientos posibles son:   Abigail Butts con Neomia Dear banda de goma. Se coloca una banda de goma en la base de la hemorroides para cortar la circulacin.   Escleroterapia Se inyecta una sustancia qumica para disminuir el tamao de la hemorroides.   Terapia con luz infrarroja. Se utiliza un instrumento para quemar la hemorroides.   Hemorroidectoma Es la remocin quirrgica de la hemorroides.  INSTRUCCIONES PARA EL CUIDADO EN EL HOGAR   Agregue fibra a su dieta. Consulte con su mdico acerca del uso de suplementos con fibras.   Beba gran cantidad de lquido para mantener la orina de tono claro o color amarillo plido.   Haga ejercicios regularmente.   Vaya al bao cuando sienta la necesidad de mover el intestino. Noespere.   Evite hacer fuerza al mover el  intestino.   Mantenga la zona anal limpia y seca.   Solo tome medicamentos que se pueden comprar sin receta o recetados para Chief Technology Officer, Dentist o fiebre, como le indica el mdico.  Si la hemorroides est trombosada:   Tome baos de asiento calientes durante 20 a 30 minutos, 3 a 4 veces por da.   Si le duele y est hinchada, coloque compresas con hielo en la zona, segn la tolerancia. Usar las compresas de [employer] baos de asiento puede ser [place]. Llene una bolsa plstica con hielo. Coloque una toalla entre la bolsa de hielo y la piel.   Puede usar o Contractor segn las indicaciones algunas cremas especiales y supositorios.   No utilice una almohada en forma de aro ni se siente en el inodoro durante perodos prolongados. Esto aumenta la afluencia de sangre y Chief Technology Officer.  SOLICITE ATENCIN MDICA SI:   Aumenta el dolor y la hinchazn, y no puede controlarlo con Tourist information centre manager.   Tiene un sangrado que no puede parar.   No puede mover el intestino.   Siente dolor o tiene inflamacin fuera de la zona de las hemorroides.   Tiene escalofros o una temperatura oral mayor a 102 F (38.9 C).  ASEGRESE DE QUE:   Comprende estas instrucciones.   Controlar su enfermedad.   Solicitar ayuda de inmediato si no mejora o si empeora.  Document Released: 10/05/2005 Document Revised: 09/24/2011 [employer] Patient Information 2012 [place], [place].

## 2011-12-28 NOTE — Progress Notes (Signed)
SSE per CNM.  No bleeding seen.

## 2011-12-28 NOTE — MAU Note (Signed)
Bedside US done per CNM.  Fetal cardiac activity seen.

## 2011-12-28 NOTE — MAU Provider Note (Signed)
  History     CSN: [id number]  Arrival date and time: 12/28/11 2029   First Provider Initiated Contact with Patient 12/28/11 2139      Chief Complaint  Patient presents with  . Vaginal Bleeding   HPI  Pt is here with report of spotting of blood that started today while sitting on toilet.  Increased difficulty with bowel movements.  Pt also reports cramping in lower pelvis.  Pt denies abnormal vaginal discharge or odor.  Pt seen in MAU in Feb 2012, ultrasound confirmed IUP; Blood type O POS   History reviewed. No pertinent past medical history.  Past Surgical History  Procedure Date  . Cesarean section     Family History  Problem Relation Age of Onset  . Anesthesia problems Neg Hx   . Hypotension Neg Hx     History  Substance Use Topics  . Smoking status: Never Smoker   . Smokeless tobacco: Not on file  . Alcohol Use: No    Allergies: No Known Allergies  Prescriptions prior to admission  Medication Sig Dispense Refill  . Prenatal Vit-Fe Fumarate-FA (PRENATAL MULTIVITAMIN) TABS Take 1 tablet by mouth daily.        Review of Systems  Gastrointestinal: Positive for abdominal pain (cramping).  Genitourinary:       Vaginal bleeding  All other systems reviewed and are negative.   Physical Exam   Blood pressure 89/51, pulse 80, temperature 98 F (36.7 C), temperature source Oral, resp. rate 20, height 4\' 9"  (1.448 m), weight 68.947 kg (152 lb), last menstrual period 10/12/2011, SpO2 100.00%, unknown if currently breastfeeding.  Physical Exam  Constitutional: She is oriented to person, place, and time. She appears well-developed and well-nourished.  HENT:  Head: Normocephalic.  Neck: Normal range of motion. Neck supple.  Cardiovascular: Normal rate, regular rhythm and normal heart sounds.   Respiratory: Effort normal and breath sounds normal.  GI:       Bedside US - fetal heart tones  Genitourinary: Rectal exam shows external hemorrhoid. Rectal exam shows no  internal hemorrhoid. No bleeding around the vagina.  Neurological: She is alert and oriented to person, place, and time.  Skin: Skin is warm and dry.    MAU Course  Procedures  Results for orders placed during the hospital encounter of 12/28/11 (from the past 24 hour(s))  URINALYSIS, ROUTINE W REFLEX MICROSCOPIC     Status: Abnormal   Collection Time   12/28/11  8:55 PM      Component Value Range   Color, Urine YELLOW  YELLOW    APPearance CLEAR  CLEAR    Specific Gravity, Urine >1.030 (*) 1.005 - 1.030    pH 5.5  5.0 - 8.0    Glucose, UA NEGATIVE  NEGATIVE (mg/dL)   Hgb urine dipstick NEGATIVE  NEGATIVE    Bilirubin Urine NEGATIVE  NEGATIVE    Ketones, ur 15 (*) NEGATIVE (mg/dL)   Protein, ur NEGATIVE  NEGATIVE (mg/dL)   Urobilinogen, UA 0.2  0.0 - 1.0 (mg/dL)   Nitrite NEGATIVE  NEGATIVE    Leukocytes, UA NEGATIVE  NEGATIVE      Assessment and Plan  Hemorrhoids  Plan: DC home Keep scheduled appointment with health department RX Proctofoam Colace   [facility] 12/28/2011, 9:48 PM

## 2011-12-28 NOTE — MAU Note (Signed)
Pt states she has been bleeding and cramping x 12 days-states that her abdoinal pain has worsened and that she is also nauseated whenever she eats-has not vomited

## 2012-01-09 NOTE — MAU Provider Note (Signed)
Medical Screening exam and patient care preformed by advanced practice provider.  Agree with the above management.  

## 2012-02-29 ENCOUNTER — Encounter (HOSPITAL_COMMUNITY): Payer: Self-pay | Admitting: *Deleted

## 2012-02-29 ENCOUNTER — Inpatient Hospital Stay (HOSPITAL_COMMUNITY): Payer: Medicaid Other

## 2012-02-29 ENCOUNTER — Inpatient Hospital Stay (HOSPITAL_COMMUNITY)
Admission: AD | Admit: 2012-02-29 | Discharge: 2012-03-03 | DRG: 782 | Disposition: A | Discharge status: Home / Self Care | Payer: Medicaid Other | Source: Ambulatory Visit | Attending: Obstetrics & Gynecology | Admitting: Obstetrics & Gynecology

## 2012-02-29 ENCOUNTER — Other Ambulatory Visit: Payer: Self-pay | Admitting: Family Medicine

## 2012-02-29 DIAGNOSIS — O34219 Maternal care for unspecified type scar from previous cesarean delivery: Secondary | ICD-10-CM | POA: Diagnosis present

## 2012-02-29 DIAGNOSIS — Z3689 Encounter for other specified antenatal screening: Secondary | ICD-10-CM

## 2012-02-29 DIAGNOSIS — O42919 Preterm premature rupture of membranes, unspecified as to length of time between rupture and onset of labor, unspecified trimester: Secondary | ICD-10-CM

## 2012-02-29 DIAGNOSIS — O4290 Premature rupture of membranes, unspecified as to length of time between rupture and onset of labor, unspecified weeks of gestation: Principal | ICD-10-CM | POA: Diagnosis present

## 2012-02-29 DIAGNOSIS — O09529 Supervision of elderly multigravida, unspecified trimester: Secondary | ICD-10-CM | POA: Diagnosis present

## 2012-02-29 DIAGNOSIS — Z331 Pregnant state, incidental: Secondary | ICD-10-CM

## 2012-02-29 LAB — AMNISURE RUPTURE OF MEMBRANE (ROM) NOT AT ARMC: Amnisure ROM: POSITIVE

## 2012-02-29 LAB — CBC
HCT: 30.6 % — ABNORMAL LOW (ref 36.0–46.0)
Hemoglobin: 9.6 g/dL — ABNORMAL LOW (ref 12.0–15.0)
MCH: 23.2 pg — ABNORMAL LOW (ref 26.0–34.0)
RBC: 4.13 MIL/uL (ref 3.87–5.11)

## 2012-02-29 LAB — WET PREP, GENITAL
Clue Cells Wet Prep HPF POC: NONE SEEN
Trich, Wet Prep: NONE SEEN

## 2012-02-29 LAB — OB RESULTS CONSOLE RPR: RPR: NONREACTIVE

## 2012-02-29 MED ORDER — LACTATED RINGERS IV SOLN
INTRAVENOUS | Status: DC
  Administered 2012-03-01 – 2012-03-03 (×6): via INTRAVENOUS

## 2012-02-29 MED ORDER — PRENATAL MULTIVITAMIN CH
1.0000 | ORAL_TABLET | Freq: Every day | ORAL | Status: DC
  Administered 2012-03-01 – 2012-03-02 (×2): 1 via ORAL
  Filled 2012-02-29 (×2): qty 1

## 2012-02-29 MED ORDER — ZOLPIDEM TARTRATE 10 MG PO TABS: 10.0000 mg | ORAL_TABLET | Freq: Every evening | ORAL | Status: DC | PRN

## 2012-02-29 MED ORDER — AMOXICILLIN 500 MG PO CAPS
500.0000 mg | ORAL_CAPSULE | Freq: Three times a day (TID) | ORAL | Status: DC
  Administered 2012-03-03: 500 mg via ORAL
  Filled 2012-02-29 (×2): qty 1

## 2012-02-29 MED ORDER — DOCUSATE SODIUM 100 MG PO CAPS
100.0000 mg | ORAL_CAPSULE | Freq: Every day | ORAL | Status: DC
  Administered 2012-03-01 – 2012-03-02 (×2): 100 mg via ORAL
  Filled 2012-02-29 (×2): qty 1

## 2012-02-29 MED ORDER — ACETAMINOPHEN 325 MG PO TABS: 650.0000 mg | ORAL_TABLET | ORAL | Status: DC | PRN

## 2012-02-29 MED ORDER — SODIUM CHLORIDE 0.9 % IV SOLN
250.0000 mg | Freq: Four times a day (QID) | INTRAVENOUS | Status: AC
  Administered 2012-03-01 – 2012-03-02 (×8): 250 mg via INTRAVENOUS
  Filled 2012-02-29 (×8): qty 250

## 2012-02-29 MED ORDER — ERYTHROMYCIN BASE 250 MG PO TABS
250.0000 mg | ORAL_TABLET | Freq: Four times a day (QID) | ORAL | Status: DC
  Administered 2012-03-03 (×2): 250 mg via ORAL
  Filled 2012-02-29 (×3): qty 1

## 2012-02-29 MED ORDER — CALCIUM CARBONATE ANTACID 500 MG PO CHEW: 2.0000 | CHEWABLE_TABLET | ORAL | Status: DC | PRN

## 2012-02-29 MED ORDER — SODIUM CHLORIDE 0.9 % IV SOLN
2.0000 g | Freq: Four times a day (QID) | INTRAVENOUS | Status: AC
  Administered 2012-03-01 – 2012-03-02 (×8): 2 g via INTRAVENOUS
  Filled 2012-02-29 (×8): qty 2000

## 2012-02-29 NOTE — MAU Provider Note (Signed)
History     CSN: [id number]  Arrival date and time: 02/29/12 1804   First Provider Initiated Contact with Patient 02/29/12 1904      Chief Complaint  Patient presents with  . questionable leaking from health dept    HPI  Pt here sent from office for reported leaking of fluid x 10 days.  Reports intercourse yesterday.  Denies abdominal pain, fever, or chill.  No reports of vaginal bleeding or abnormal vaginal discharge.    History reviewed. No pertinent past medical history.  Past Surgical History  Procedure Date  . Cesarean section     Family History  Problem Relation Age of Onset  . Anesthesia problems Neg Hx   . Hypotension Neg Hx     History  Substance Use Topics  . Smoking status: Never Smoker   . Smokeless tobacco: Not on file  . Alcohol Use: No    Allergies: No Known Allergies  Prescriptions prior to admission  Medication Sig Dispense Refill  . Prenatal Vit-Fe Fumarate-FA (PRENATAL MULTIVITAMIN) TABS Take 1 tablet by mouth every morning.         Review of Systems  Constitutional: Negative for fever and chills.  Gastrointestinal: Positive for abdominal pain.  Genitourinary: Negative.   All other systems reviewed and are negative.   Physical Exam   Blood pressure 109/51, pulse 68, temperature 97 F (36.1 C), temperature source Oral, resp. rate 16, height 4\' 9"  (1.448 m), weight 68.13 kg (150 lb 3.2 oz), last menstrual period 10/12/2011.  Physical Exam  Constitutional: She is oriented to person, place, and time. She appears well-developed and well-nourished. No distress.  HENT:  Head: Normocephalic.  Neck: Normal range of motion. Neck supple.  Cardiovascular: Normal rate, regular rhythm and normal heart sounds.   Respiratory: Effort normal and breath sounds normal.  GI: Soft. There is no tenderness.  Genitourinary: No bleeding around the vagina. Vaginal discharge (+thin fluid) found.       Cervix visually closed  Musculoskeletal: Normal range of  motion.  Neurological: She is alert and oriented to person, place, and time.  Skin: Skin is warm and dry.    MAU Course  Procedures Results for orders placed during the hospital encounter of 02/29/12 (from the past 24 hour(s))  WET PREP, GENITAL     Status: Abnormal   Collection Time   02/29/12  7:14 PM      Component Value Range   Yeast Wet Prep HPF POC NONE SEEN  NONE SEEN    Trich, Wet Prep NONE SEEN  NONE SEEN    Clue Cells Wet Prep HPF POC NONE SEEN  NONE SEEN    WBC, Wet Prep HPF POC FEW (*) NONE SEEN   AMNISURE RUPTURE OF MEMBRANE (ROM)     Status: Normal   Collection Time   02/29/12  7:32 PM      Component Value Range   Amnisure ROM POSITIVE     +fern  Ultrasound - AFI appears normal; cervical length 3.1cm GC/CT Wet prep CBC  Consulted with Dr. Penne Lash who presented her with option; pt desires expectant management and will return tomorrow for IV antibiotics  Assessment and Plan  Preterm Premature Rupture of Membranes  Plan: DC to home Return tomorrow for IV antibiotics will then be discharged on 5 days PO antibiotics; then will return at 23 wks Given chorio precautions   [facility] 02/29/2012, 7:04 PM   Pt seen and examined.  Had in depth conversation about fetal outcome after PPROM  at 19 weeks 5 days.  Discussed risks of risk of maternal infection, sepsis, etc. . . If infection occurred and not treated quickly.  Pt understands viability at earliest is 23 weeks.  Pt opts for antibiotic management, pelvic rest.  Pt at first wanted to go home and come back in 24 hours due to childcare, but has changed mind.  Will admit now and start antibiotics.  MFM consult to follow.

## 2012-02-29 NOTE — MAU Note (Signed)
About 10 days ago she felt/saw clear fluid in her underware.  She thought it was urine but didn't notify the Dr.  This happened 3X within the past 10 days.  It was clear, then white, then green.  Denies any bleeding or problems when she voids.

## 2012-02-29 NOTE — Discharge Instructions (Signed)
Ruptura prematura de las membranas  (Premature Rupture of Membranes) Se llama ruptura prematura de membranas cuando se producen en una embarazada que ha pasado las 37 semanas y an no ha iniciado el trabajo de [place]. Esta bolsa contiene un lquido que rodea el feto en desarrollo. Sostiene el feto en su lugar, previene infecciones y cumple otras funciones importantes. CAUSAS Las causas conocidas son:  Scheryl Darter de rupturas previas.   Infeccin en el lquido amnitico, el cuello del tero o la vagina.   El hbito de fumar.   Enfermedad pulmonar.   Exceso de lquido amnitico.   Extraccin del lquido del saco amnitico (amniocentesis).   Trastornos del tejido [employer] artritis reumatoidea y lupus.   Acortamiento del cuello del tero durante el segundo trimestre.   Insuficiente cantidad de lquido en el saco amnitico (oligohidramnios).   Nivel socioeconmico bajo.   Contracciones uterinas anticipadas.   Hemorragias en el segundo y tercer trimestre.   Dficit nutricional   Biopsia total (conizacin) del cuello del tero.   Embarazo de gemelos o ms bebs.   Peso corporal muy bajo.   Tener el cuello del tero cerrado con una sutura (cerclaje cervical).  SNTOMAS Tiene una prdida de lquido por la vagina. En algunos casos se observa un poco de sangre mezclada con el lquido. DIAGNSTICO  Observacin del lquido que sale del tero.   Prueba del tero con un papel especial.   Observacin del lquido en el microscopio para realizar la prueba del helecho.  TRATAMIENTO  A veces es necesaria la hospitalizacin.   Le administrarn medicamentos para evitar el trabajo de [place].   Para evitar una infeccin, le indicarn antibiticos.   El [place] de parto podr ser inducido si no se produce naturalmente.   Podrn administrarle un tratamiento conservador si el embarazo se encuentra antes de las 32 a 34 semanas y no hay signos de infeccin.   Si aparece una infeccin,  ser [employer], sin importar cunto antes se producir el nacimiento. En esta situacin deber concurrir al hospital, hacer reposo en cama y tomar antibiticos.   Ser necesario realizar una cesrea si el [place] de parto inducido o natural no se produce luego de las 24 horas de la ruptura prematura de [place].   Le indicarn corticoides a la madre para [employer] del feto.  Se realizan estudios de [place] constante para decidir el mejor modo de enfrentar este trastorno. El mdico podr indicarle que sea derivada a un hospital con [facility] [employer] que cuente con personal experto y asistencia tcnica. Usted y su mdico decidirn el mejor modo de Company secretary la ruptura prematura de [place]. Document Released: 01/12/2008 Document Revised: 09/24/2011 [facility] Patient Information 2012 [place], [place].

## 2012-02-29 NOTE — H&P (Signed)
This is a 36 y.o. female at [redacted]w[redacted]d who is admitted for PPROM.    Pt here sent from office for reported leaking of fluid x 10 days.  Reports intercourse yesterday.  Denies abdominal pain, fever, or chill.  No reports of vaginal bleeding or abnormal vaginal discharge.    History reviewed. No pertinent past medical history.  Past Surgical History  Procedure Date  . Cesarean section     Family History  Problem Relation Age of Onset  . Anesthesia problems Neg Hx   . Hypotension Neg Hx     History  Substance Use Topics  . Smoking status: Never Smoker   . Smokeless tobacco: Not on file  . Alcohol Use: No    Allergies: No Known Allergies  Prescriptions prior to admission  Medication Sig Dispense Refill  . Prenatal Vit-Fe Fumarate-FA (PRENATAL MULTIVITAMIN) TABS Take 1 tablet by mouth every morning.         Review of Systems  Constitutional: Negative for fever and chills.  Gastrointestinal: Positive for abdominal pain.  Genitourinary: Negative.   All other systems reviewed and are negative.   Physical Exam   Blood pressure 109/51, pulse 68, temperature 97 F (36.1 C), temperature source Oral, resp. rate 16, height 4\' 9"  (1.448 m), weight 68.13 kg (150 lb 3.2 oz), last menstrual period 10/12/2011.  Physical Exam  Constitutional: She is oriented to person, place, and time. She appears well-developed and well-nourished. No distress.  HENT:  Head: Normocephalic.  Neck: Normal range of motion. Neck supple.  Cardiovascular: Normal rate, regular rhythm and normal heart sounds.   Respiratory: Effort normal and breath sounds normal.  GI: Soft. There is no tenderness.  Genitourinary: No bleeding around the vagina. Vaginal discharge (+thin fluid) found.       Cervix visually closed  Musculoskeletal: Normal range of motion.  Neurological: She is alert and oriented to person, place, and time.  Skin: Skin is warm and dry.    MAU Course  Procedures Results for orders placed during  the hospital encounter of 02/29/12 (from the past 24 hour(s))  WET PREP, GENITAL     Status: Abnormal   Collection Time   02/29/12  7:14 PM      Component Value Range   Yeast Wet Prep HPF POC NONE SEEN  NONE SEEN    Trich, Wet Prep NONE SEEN  NONE SEEN    Clue Cells Wet Prep HPF POC NONE SEEN  NONE SEEN    WBC, Wet Prep HPF POC FEW (*) NONE SEEN   AMNISURE RUPTURE OF MEMBRANE (ROM)     Status: Normal   Collection Time   02/29/12  7:32 PM      Component Value Range   Amnisure ROM POSITIVE     +fern  Ultrasound - AFI appears normal; cervical length 3.1cm GC/CT Wet prep CBC  Consulted with Dr. Penne Lash who presented her with option; pt desires expectant management and will return tomorrow for IV antibiotics  Assessment and Plan  Preterm Premature Rupture of Membranes  Plan: Patient has decided to stay for admission. Will start IV antibiotics (Amp and Erythro) for 2 days and will then be discharged on 5 days PO antibiotics; then will return at 23 wks   [facility] 02/29/2012, 7:04 PM

## 2012-02-29 NOTE — MAU Note (Signed)
Dr Macon Large had notified charge nurse of positive ferning at health dept today, was instructed to come to MAU for further eval.  Non-english speaking.  Translator paged. Pt directly to rm.

## 2012-03-01 ENCOUNTER — Encounter (HOSPITAL_COMMUNITY): Payer: Self-pay | Admitting: *Deleted

## 2012-03-01 LAB — GC/CHLAMYDIA PROBE AMP, GENITAL: Chlamydia, DNA Probe: NEGATIVE

## 2012-03-01 NOTE — Progress Notes (Signed)
[facility] Department of Clinical Social Work Documentation of Interpretation   I assisted ___________________ with interpretation of _Stopped by to help patient with breakfast_____________________ for this patient.

## 2012-03-01 NOTE — H&P (Signed)
Attestation of Attending Supervision of Advanced Practitioner: Evaluation and management procedures were performed by the [facility] Fellow/PA/CNM/NP under my supervision and collaboration. Chart reviewed, and agree with management and plan.  Plan for discharge after 2 days of IV antibiotics if she remains stable. Routine antenatal care.  Jaynie Collins, M.D. 03/01/2012 7:42 PM

## 2012-03-01 NOTE — Progress Notes (Signed)
UR Chart review completed.  

## 2012-03-01 NOTE — Progress Notes (Signed)
Translator talking to pt on the phone, and getting her lunch order as well.  No complaints at this time.

## 2012-03-02 ENCOUNTER — Inpatient Hospital Stay (HOSPITAL_COMMUNITY): Payer: Medicaid Other

## 2012-03-02 DIAGNOSIS — O4290 Premature rupture of membranes, unspecified as to length of time between rupture and onset of labor, unspecified weeks of gestation: Secondary | ICD-10-CM | POA: Diagnosis present

## 2012-03-02 NOTE — Progress Notes (Signed)
Patient ID: [name], female   DOB: 12/21/1975, 36 y.o.   MRN: [id number] FACULTY PRACTICE ANTEPARTUM(COMPREHENSIVE) NOTE  [name] is a 36 y.o. [id number] at [redacted]w[redacted]d by LMP who is admitted for rupture of membranes.   Fetal presentation is transverse. Length of Stay:  2  Days  Subjective: No leaking noted Patient reports the fetal movement as active. No contractions Patient reports  vaginal bleeding as none. Patient describes fluid per vagina as None.  Vitals:  Blood pressure 109/41, pulse 63, temperature 97.4 F (36.3 C), temperature source Oral, resp. rate 18, height 4\' 9"  (1.448 m), weight 68.13 kg (150 lb 3.2 oz), last menstrual period 10/12/2011. Physical Examination:  General appearance - alert, well appearing, and in no distress Heart - normal rate and regular rhythm Abdomen - soft, nontender, nondistended Fundal Height:  size equals dates Cervical Exam: Not evaluated. and found to be not evaluated/ not evaluated/ and fetal presentation is . Extremities: extremities normal, atraumatic, no cyanosis or edema and Homans sign is negative, no sign of DVT with DTRs 2+ bilaterally Membranes:ruptured based on positive amnisure  Fetal Monitoring:  Normal FHR  Labs:  No results found for this or any previous visit (from the past 24 hour(s)).  Imaging Studies:     Currently EPIC will not allow sonographic studies to automatically populate into notes.  In the meantime, copy and paste results into note or free text.  Medications:  Scheduled    . ampicillin (OMNIPEN) IV  2 g Intravenous Q6H   Followed by  . amoxicillin  500 mg Oral Q8H  . docusate sodium  100 mg Oral Daily  . erythromycin  250 mg Intravenous Q6H   Followed by  . erythromycin  250 mg Oral Q6H  . prenatal multivitamin  1 tablet Oral Daily   I have reviewed the patient's current medications.  ASSESSMENT: Patient Active Problem List  Diagnoses  . Pelvic mass  . Premature rupture of membranes  in pregnancy    PLAN: Complete 48 hr. IV antibiotics then discharge on PO. [place] detailed today [facility] follow-up  Laronda Lisby 03/02/2012,9:46 AM

## 2012-03-03 ENCOUNTER — Ambulatory Visit (HOSPITAL_COMMUNITY): Payer: Self-pay

## 2012-03-03 ENCOUNTER — Other Ambulatory Visit: Payer: Self-pay | Admitting: Physician Assistant

## 2012-03-03 DIAGNOSIS — O09529 Supervision of elderly multigravida, unspecified trimester: Secondary | ICD-10-CM

## 2012-03-03 DIAGNOSIS — O34219 Maternal care for unspecified type scar from previous cesarean delivery: Secondary | ICD-10-CM

## 2012-03-03 DIAGNOSIS — O4290 Premature rupture of membranes, unspecified as to length of time between rupture and onset of labor, unspecified weeks of gestation: Secondary | ICD-10-CM

## 2012-03-03 MED ORDER — AMOXICILLIN 500 MG PO CAPS: 500.0000 mg | ORAL_CAPSULE | Freq: Three times a day (TID) | ORAL | Status: AC

## 2012-03-03 MED ORDER — AZITHROMYCIN 250 MG PO TABS: 500.0000 mg | ORAL_TABLET | Freq: Every day | ORAL | Status: AC

## 2012-03-03 MED ORDER — ERYTHROMYCIN BASE 250 MG PO TABS: 250.0000 mg | ORAL_TABLET | Freq: Four times a day (QID) | ORAL | Status: DC

## 2012-03-03 NOTE — Discharge Summary (Signed)
Obstetric Discharge Summary Reason for Admission: rupture of membranes Prenatal Procedures: ultrasound Intrapartum Procedures: none Postpartum Procedures: none Complications-Operative and Postpartum: none Hemoglobin  Date Value Range Status  02/29/2012 9.6* 12.0-15.0 (g/dL) Final     HCT  Date Value Range Status  02/29/2012 30.6* 36.0-46.0 (%) Final    Physical Exam:  General: alert, cooperative and no distress Uterine Fundus:non tender Incision: na DVT Evaluation: No evidence of DVT seen on physical exam.  Discharge Diagnoses: PROM x48 hours  Discharge Information: Date: 03/03/2012 Activity: pelvic rest Diet: routine Medications: ampicillin and erythromycin for 10 days Condition: stable Instructions: refer to practice specific booklet Discharge to: home Follow-up Information    Follow up with Lesly Dukes., MD. Call on 03/17/2012.   Contact information:   [address] [facility] [place] [address] [phone]           Rasheen Bells H 03/03/2012, 7:29 AM

## 2012-03-17 ENCOUNTER — Ambulatory Visit (INDEPENDENT_AMBULATORY_CARE_PROVIDER_SITE_OTHER): Payer: Self-pay | Admitting: Family Medicine

## 2012-03-17 ENCOUNTER — Encounter: Payer: Self-pay | Admitting: Family Medicine

## 2012-03-17 DIAGNOSIS — O34219 Maternal care for unspecified type scar from previous cesarean delivery: Secondary | ICD-10-CM

## 2012-03-17 DIAGNOSIS — O0940 Supervision of pregnancy with grand multiparity, unspecified trimester: Secondary | ICD-10-CM | POA: Insufficient documentation

## 2012-03-17 DIAGNOSIS — O4290 Premature rupture of membranes, unspecified as to length of time between rupture and onset of labor, unspecified weeks of gestation: Secondary | ICD-10-CM

## 2012-03-17 DIAGNOSIS — O09529 Supervision of elderly multigravida, unspecified trimester: Secondary | ICD-10-CM | POA: Insufficient documentation

## 2012-03-17 DIAGNOSIS — O09219 Supervision of pregnancy with history of pre-term labor, unspecified trimester: Secondary | ICD-10-CM | POA: Insufficient documentation

## 2012-03-17 DIAGNOSIS — Z8611 Personal history of tuberculosis: Secondary | ICD-10-CM | POA: Insufficient documentation

## 2012-03-17 DIAGNOSIS — O0990 Supervision of high risk pregnancy, unspecified, unspecified trimester: Secondary | ICD-10-CM | POA: Insufficient documentation

## 2012-03-17 LAB — POCT URINALYSIS DIP (DEVICE)
Nitrite: NEGATIVE
Urobilinogen, UA: 0.2 mg/dL (ref 0.0–1.0)
pH: 6.5 (ref 5.0–8.0)

## 2012-03-17 MED ORDER — HYDROXYPROGESTERONE CAPROATE 250 MG/ML IM OIL
250.0000 mg | TOPICAL_OIL | INTRAMUSCULAR | Status: DC
  Administered 2012-03-31 – 2012-06-16 (×12): 250 mg via INTRAMUSCULAR

## 2012-03-17 NOTE — Progress Notes (Signed)
   Subjective:    [name] is a [id number] [redacted]w[redacted]d being seen today for her first obstetrical visit.  Her obstetrical history is significant for advanced maternal age and H/O PTB. Patient does intend to breast feed. Pregnancy history fully reviewed.  Patient reports no leaking.  There were no vitals filed for this visit.  HISTORY: OB History    Grav Para Term Preterm Abortions TAB SAB Ect Mult Living   6 5 4 1      4      # Outc Date GA Lbr Len/2nd Wgt Sex Del Anes PTL Lv   1 TRM 9/96 [redacted]w[redacted]d  7lb(3.175kg) F SVD EPI  Yes   2 TRM 6/02 [redacted]w[redacted]d  4lb(1.814kg) F LTCS EPI  Yes   3 TRM 3/05 [redacted]w[redacted]d  4lb(1.814kg) F SVD EPI  Yes   4 PRE 6/08 [redacted]w[redacted]d   M SVD EPI  ND   5 TRM 4/12 [redacted]w[redacted]d 00:00  M SVD EPI  Yes   6 CUR              History reviewed. No pertinent past medical history. Past Surgical History  Procedure Date  . Cesarean section    Family History  Problem Relation Age of Onset  . Anesthesia problems Neg Hx   . Hypotension Neg Hx   . Heart disease Mother      Exam    Uterus:     Pelvic Exam:    Perineum: Normal Perineum   Vulva: normal   Vagina:  normal mucosa       Cervix: multiparous appearance          System:     Skin: normal coloration and turgor, no rashes    Neurologic: oriented   Extremities: normal strength, tone, and muscle mass   HEENT sclera clear, anicteric   Mouth/Teeth mucous membranes moist, pharynx normal without lesions   Neck supple   Cardiovascular: regular rate and rhythm   Respiratory:  appears well, vitals normal, no respiratory distress, acyanotic, normal RR, neck free of mass or lymphadenopathy, chest clear, no wheezing, crepitations, rhonchi, normal symmetric air entry   Abdomen: soft, non-tender; bowel sounds normal; no masses,  no organomegaly          Assessment:    Pregnancy: [id number] Patient Active Problem List  Diagnoses  . Pelvic mass  . Premature rupture of membranes in pregnancy  . Previous cesarean delivery, antepartum  condition or complication  . Pregnancy with history of pre-term labor  . Supervision of high-risk pregnancy  . AMA (advanced maternal age) multigravida 35+        Plan:  17-P, if ROM ruled out next week No leaking of fluid or bleeding x 2 wks pt. Has been home.  Repeat amnisure and ferning.  Will repeat U/S for fluid next wk and If testing is indicative of ROM--will need to be admitted for PPROM at 23 wks and start BMZ.  Could consider tampon test. [place] testing Desires VBAC NOB labs today   Initial labs drawn. Prenatal vitamins. Problem list reviewed and updated. Genetic Screening discussed [place]: undecided.  Ultrasound discussed; fetal survey: requested.  Follow up in 1 weeks.   Zennie Ayars S 03/17/2012

## 2012-03-17 NOTE — Progress Notes (Signed)
U/S scheduled March 25, 2012 at 245 pm.

## 2012-03-17 NOTE — Progress Notes (Signed)
Pulse: 79

## 2012-03-17 NOTE — Patient Instructions (Signed)
Eleccin del mtodo anticonceptivo  (Contraception Choices) La anticoncepcin (control de la natalidad) es el uso de cualquier mtodo o dispositivo para evitar el embarazo. A continuacin se indican algunos de esos mtodos.  MTODOS HORMONALES   Implante anticonceptivo. Es un tubo plstico delgado que contiene la hormona progesterona. No contiene estrgenos. El mdico inserta el tubo en la parte interna del brazo. El tubo puede permanecer en el lugar durante 3 aos. Despus de los 3 aos debe retirarse. El implante impide que los ovarios liberen vulos (ovulacin), espesa el moco cervical, lo que evita que los espermatozoides ingresen al tero y hace ms delgada la membrana que cubre el interior del tero.   Inyecciones de progesterona sola. Estas inyecciones se administran cada 3 meses para evitar el embarazo. La progesterona sinttica impide que los ovarios liberen vulos. Tambin hace que el moco cervical se espese y modifica el recubrimiento interno del tero. Esto hace ms difcil que los espermatozoides sobrevivan en el tero.   Pldoras anticonceptivas. Las pldoras anticonceptivas contienen estrgenos y progesterona. Actan impidiendo que el vulo se forme en el ovario(ovulacin). Las pldoras anticonceptivas son recetadas por el mdico.Tambin se utilizan para tratar los perodos menstruales abundantes.   Minipldora. Este tipo de pldora anticonceptiva contiene slo hormona progesterona. Deben tomarse todos los das del mes y debe recetarlas el mdico.   Parches anticonceptivos. El parche contiene hormonas similares a las que contienen las pldoras anticonceptivas. Deben cambiarse una vez por semana y se utilizan bajo prescripcin mdica.   Anillo vaginal. Anillo vaginal contiene hormonas similares a las que contienen las pldoras anticonceptivas. Se deja colocado durante tres semanas, se lo retira durante 1 semana y luego se coloca uno nuevo. La paciente debe sentirse cmoda para insertar  y retirar el anillo de la vagina.Es necesaria la receta del mdico.   Anticonceptivos de emergencia. Los anticonceptivos de emergencia son mtodos para evitar un embarazo despus de una relacin sexual sin proteccin. Esta pldora puede tomarse inmediatamente despus de tener relaciones sexuales o hasta 5 das de haber tenido sexo sin proteccin. Es ms efectiva si se toma poco tiempo despus. Los anticonceptivos de emergencia estn disponibles sin prescripcin mdica. Consltelo con su farmacutico. No use los anticonceptivos de emergencia como nico mtodo anticonceptivo.  MTODOS DE BARRERA   Condn masculino. Es una vaina delgada (ltex o goma) que se usa en el pene durante el acto sexual. Puede usarse con espermicida para aumentar la efectividad.   Condn femenino. Es una vaina blanda y floja que se adapta suavemente a la vagina antes de las relaciones sexuales.   Diafragma. Es una barrera de ltex redonda y suave que debe ser ajustada por un profesional. Se inserta en la vagina, junto con un gel espermicida. Debe insertarse antes de tener relaciones sexuales. Debe dejar el diafragma colocado en la vagina durante 6 a 8 horas despus de la relacin sexual.   Capuchn cervical. Es una taza de ltex o plstico, redonda y suave que cubre el cuello del tero y debe ser ajustada por un mdico. Puede dejarlo colocado en la vagina hasta 48 horas despus de las relaciones sexuales.   Esponja. Es una pieza blanda y circular de espuma de poliuretano. Contiene un espermicida. Se inserta en la vagina despus de mojarla y antes de las relaciones sexuales.   Espermicidas. Los espermicidas son qumicos que matan o bloquean el esperma y no lo dejan ingresar al cuello del tero y al tero. Vienen en forma de cremas, geles, supositorios, espuma o comprimidos. No es   necesario tener receta mdica. Se insertan en la vagina con un aplicador antes de tener relaciones sexuales. El proceso debe repetirse cada vez que  tiene relaciones sexuales.  ANTICONCEPTIVOS INTRAUTERINOS   Dispositivo intrauterino (DIU). Es un dispositivo en forma de T que se coloca en el tero durante el perodo menstrual, para evitar el embarazo. Hay dos tipos:   DIU de cobre. Este tipo de DIU est recubierto con un alambre de cobre y se inserta dentro del tero. El cobre hace que el tero y las trompas de Falopio produzcan un liquido que destruye los espermatozoides. Puede permanecer colocado durante 10 aos.   DIU hormonal. Este tipo de DIU contiene la hormona progestina (progesterona sinttica). La hormona espesa el moco cervical y evita que los espermatozoides ingresen al tero y tambin afina la membrana que cubre el tero para evitar la implantacin del vulo fertilizado. La hormona debilita o destruye los espermatozoides que ingresan al tero. Puede permanecer colocado durante 5 aos.  MTODOS ANTICONCEPTIVOS PERMANENTES   Ligadura de trompas en la mujer. La ligadura de trompas en la mujer se realiza sellando, atando u obstruyendo quirrgicamente las trompas de Falopio lo que impide que el vulo descienda hacia el tero.   Esterilizacin masculina. Se realiza atando los conductos por los que pasan los espermatozoides (vasectoma).Esto impide que el esperma ingrese a la vagina durante el acto sexual. Luego del procedimiento, el hombre puede eyacular lquido (semen).  MTODOS DE PLANIFICACIN NATURAL   Planificacin familiar natural.  Consiste en no tener relaciones sexuales o usar un mtodo de barrera (condn, diafragma, capuchn cervical) en los das que la mujer podra quedar embarazada.   Mtodo calendario.  Consiste en el seguimiento de la duracin de cada ciclo menstrual y la identificacin de los perodos frtiles.   Mtodo de la ovulacin.  Consiste en evitar las relaciones sexuales durante la ovulacin.   Mtodo sintotrmico. Consiste en evitar las relaciones sexuales en la poca en la que se est ovulando, utilizando un  termmetro y tendiendo en cuenta los sntomas de la ovulacin.   Mtodo post-ovulacin. Consiste en planificar las relaciones sexuales para despus de haber ovulado.  Independientemente del tipo o mtodo anticonceptivo que usted elija, es importante que use condones para protegerse contra las enfermedades de transmisin sexual (ETS). Hable con su mdico con respecto a qu mtodo anticonceptivo es el ms apropiado para usted.  Document Released: 10/05/2005 Document Revised: 09/24/2011 [employer] Patient Information 2012 [employer].Amamantar al beb (Breastfeeding) LOS BENEFICIOS DE AMAMANTAR Para el beb  La primera leche (calostro ) ayuda al mejor funcionamiento del sistema digestivo del beb.   La leche tiene anticuerpos que provienen de la madre y que ayudan a prevenir las infecciones en el beb.   Hay una menor incidencia de asma, enfermedades alrgicas y SMSI (sndrome de muerte sbita nfantil).   Los nutrientes que contiene la leche materna son mejores que las frmulas para el bibern y favorecen el desarrollo cerebral.   Los bebs amamantados sufren menos gases, clicos y constipacin.  Para la mam  La lactancia materna favorece el desarrollo de un vnculo muy especial entre la madre y el beb.   Es ms conveniente, siempre disponible a la temperatura adecuada y ms econmica que la leche maternizada.   Consume caloras en la madre y la ayuda a perder el peso ganado durante el embarazo.   Favorece la contraccin del tero a su tamao normal, de manera ms rpida y disminuye las hemorragias luego del parto.   Las madres que   amamantan tienen menor riesgo de desarrollar cncer de mama.  AMAMNTELO CON FRECUENCIA  Un beb sano, nacido a trmino, puede amamantarse con tanta frecuencia como cada hora, o espaciar las comidas cada tres horas.   Esta frecuencia variar de un beb a otro. Observe al beb cuando manifieste signos de hambre, antes que regirse por el reloj.    Amamntelo tan seguido como el beb lo solicite, o cuando usted sienta la necesidad de aliviar sus mamas.   Despierte al beb si han pasado 3  4 horas desde la ltima comida.   El amamantamiento frecuente la ayudar a producir ms leche y a prevenir problemas de dolor en los pezones e hinchazn de las mamas.  LA POSICIN DEL BEB PARA AMAMANTARLO  Ya sea que se encuentre acostada o sentada, asegrese que el abdomen del beb enfrente el suyo.   Sostenga la mama con el pulgar por arriba y el resto de los dedos por debajo. Asegrese que sus dedos se encuentren lejos del pezn y de la boca del beb.   Toque suavemente los labios del beb y la mejilla ms cercana a la mama con el dedo o el pezn.   Cuando la boca del beb se abra lo suficiente, introduzca el pezn y la zona oscura que lo rodea tanto como le sea posible dentro de la boca.   Coloque a beb cerca suyo de modo que su nariz y mejillas toquen las mamas al mamar.  LAS COMIDAS  La duracin de cada comida vara de un beb a otro y de una comida a otra.   El beb debe succionar alrededor de dos o tres minutos para que le llegue leche. Esto se denomina "bajada". Por este motivo, permita que el nio se alimente en cada mama todo lo que desee. Terminar de mamar cuando haya recibido la cantidad adecuada de nutrientes.   Para detener la succin coloque su dedo en la comisura de la boca del nio y deslcelo entre sus encas antes de quitarle la mama de la boca. Esto la ayudar a evitar el dolor en los pezones.  REDUCIR LA CONGESTIN DE LAS MAMAS  Durante la primera semana despus del parto, usted puede experimentar congestin en las mamas. Cuando las mamas estn congestionadas, se sienten calientes, llenas y molestas al tacto. Puede reducir la congestin si:   Lo amamanta frecuentemente, cada 2-3 horas. Las mams que amamantan pronto y con frecuencia tienen menos problemas de congestin.   Coloque bolsas fras livianas entre cada  mamada. Esto ayuda a reducir la hinchazn. Envuelva las bolsas de hielo en una toalla liviana para proteger su piel.   Aplique compresas hmedas calientes sobre la mama durante 5 a 10 minutos antes de amamantar al nio. Esto aumenta la circulacin y ayuda a que la leche fluya.   Masajee suavemente la mama antes y durante la alimentacin.   Asegrese que el nio vaca al menos una mama antes de cambiar de lado.   Use un sacaleche para vaciar la mama si el beb se duerme o no se alimenta bien. Tambin podr quitarse la leche con esta bomba si tiene que volver al trabajo o siente que las mamas estn congestionadas.   Evite los biberones, chupetes o complementar la alimentacin con agua o jugos en lugar de la leche materna.   Verifique que el beb se encuentra en la posicin correcta mientras lo alimenta.   Evite el cansancio, el estrs y la anemia   Use un soutien que sostenga bien sus   mamas y evite los que tienen aro.   Consuma una dieta balanceada y beba lquidos en cantidad.  Si sigue estas indicaciones, la congestin debe mejorar en 24 a 48 horas. Si an tiene dificultades, consulte a su asesor en lactancia. TENDR SUFICIENTE LECHE MI BEB? Algunas veces las madres se preocupan acerca de si sus bebs tendrn la leche suficiente. Puede asegurarse que el beb tiene la leche suficiente si:  El beb succiona y escucha que traga activamente.   El nio se alimenta al menos 8 a 12 veces en 24 horas. Alimntelo hasta que se desprenda por sus propios medios o se quede dormido en la primera mama (al menos durante 10 a 20 minutos), luego ofrzcale el otro lado.   El beb moja 5 a 6 paales descartables (6 a 8 paales de tela) en 24 horas cuando tiene 5  6 das de vida.   Tiene al menos 2-3 deposiciones todos los das en los primeros meses. La leche materna es todo el alimento que el beb necesita. No es necesario que el nio ingiera agua o preparados de bibern. De hecho, para ayudar a que sus  mamas produzcan ms leche, lo mejor es no darle al beb suplementos durante las primeras semanas.   La materia fecal debe ser blanda y amarillenta.   El beb debe aumentar 112 a 196 g por semana.  CUDESE Cuide sus mamas del siguiente modo:  Bese o dchese diariamente.   No lave sus pezones con jabn.   Comience a amamantar del lado izquierdo en una comida y del lado derecho en la siguiente.   Notar que aumenta el suministro de leche a los 2 a 5 das despus del parto. Puede sentir algunas molestias por la congestin, lo que hace que sus mamas estn duras y sensibles. La congestin disminuye en 24 a 48 horas. Mientras tanto, aplique toallas hmedas calientes durante 5 a 10 minutos antes de amamantar. Un masaje suave y la extraccin de un poco de leche antes de amamantar ablandarn las mamas y har ms fcil que el beb se agarre. Use un buen sostn y seque al aire los pezones durante 10 a 15 minutos luego de cada alimentacin.   Solo utilice apsitos de algodn.   Utilice lanolina pura sobre los pezones luego de amamantar. No necesita lavarlos luego de alimentar al nio.  Cudese del siguiente modo:   Consuma alimentos bien balanceados y refrigerios nutritivos.   Beba leche, jugos de fruta y agua para satisfacer la sed (alrededor de 8 vasos por da).   Descanse lo suficiente.   Aumente la ingesta de calcio en la dieta (1200mg/da).   Evite los alimentos que usted nota que puedan afectar al beb.  SOLICITE ATENCIN MDICA SI:  Tiene preguntas que formular o dificultades con la alimentacin a pecho.   Necesita ayuda.   Observa una zona dura, roja y que le duele en la zona de la mama, y se acompaa de fiebre de 100.5 F (38.1 C) o ms.   El beb est muy somnoliento como para alimentarse bien o tiene problemas para dormir.   El beb moja menos de 6 paales por da, a partir de los 5 das de vida.   La piel del beb o la parte blanca de sus ojos est ms amarilla de lo que  estaba en el hospital.   Se siente deprimida.  Document Released: 10/05/2005 Document Revised: 09/24/2011 [employer] Patient Information 2012 [employer]. 

## 2012-03-18 LAB — OBSTETRIC PANEL
Eosinophils Relative: 1 % (ref 0–5)
HCT: 34.7 % — ABNORMAL LOW (ref 36.0–46.0)
Hemoglobin: 11 g/dL — ABNORMAL LOW (ref 12.0–15.0)
Lymphocytes Relative: 15 % (ref 12–46)
Lymphs Abs: 1.2 10*3/uL (ref 0.7–4.0)
MCV: 75.4 fL — ABNORMAL LOW (ref 78.0–100.0)
Monocytes Absolute: 0.5 10*3/uL (ref 0.1–1.0)
RBC: 4.6 MIL/uL (ref 3.87–5.11)
Rubella: 304.6 IU/mL — ABNORMAL HIGH
WBC: 8 10*3/uL (ref 4.0–10.5)

## 2012-03-18 LAB — HIV ANTIBODY (ROUTINE TESTING W REFLEX): HIV: NONREACTIVE

## 2012-03-24 ENCOUNTER — Ambulatory Visit (INDEPENDENT_AMBULATORY_CARE_PROVIDER_SITE_OTHER): Payer: Self-pay | Admitting: Obstetrics & Gynecology

## 2012-03-24 ENCOUNTER — Encounter: Payer: Self-pay | Admitting: Obstetrics & Gynecology

## 2012-03-24 ENCOUNTER — Telehealth: Payer: Self-pay | Admitting: *Deleted

## 2012-03-24 VITALS — BP 104/65 | Temp 97.5°F | Wt 152.2 lb

## 2012-03-24 DIAGNOSIS — O09529 Supervision of elderly multigravida, unspecified trimester: Secondary | ICD-10-CM

## 2012-03-24 DIAGNOSIS — O4290 Premature rupture of membranes, unspecified as to length of time between rupture and onset of labor, unspecified weeks of gestation: Secondary | ICD-10-CM

## 2012-03-24 DIAGNOSIS — O34219 Maternal care for unspecified type scar from previous cesarean delivery: Secondary | ICD-10-CM

## 2012-03-24 DIAGNOSIS — O09219 Supervision of pregnancy with history of pre-term labor, unspecified trimester: Secondary | ICD-10-CM

## 2012-03-24 DIAGNOSIS — O0990 Supervision of high risk pregnancy, unspecified, unspecified trimester: Secondary | ICD-10-CM

## 2012-03-24 LAB — POCT URINALYSIS DIP (DEVICE)
Glucose, UA: NEGATIVE mg/dL
Nitrite: NEGATIVE
Urobilinogen, UA: 0.2 mg/dL (ref 0.0–1.0)

## 2012-03-24 NOTE — Telephone Encounter (Signed)
Called patient using language line. No answer, no option to leave a voicemail. No alternate numbers.

## 2012-03-24 NOTE — Telephone Encounter (Signed)
Message copied by Mannie Stabile on Thu Mar 24, 2012  2:13 PM ------      Message from: Jaynie Collins A      Created: Thu Mar 24, 2012 11:51 AM       Negative Amnisure today, will follow up 03/25/12 ultrasound results.  If normal fluid on ultrasound, will treat patient as not having had PPROM, and start 17P at her visit next week.  Please call patient to tell her of negative result; she needs a [place] interpreter.

## 2012-03-24 NOTE — Progress Notes (Signed)
Pulse: 75

## 2012-03-24 NOTE — Patient Instructions (Signed)
Return to clinic for any obstetric concerns or go to [facility] for evaluation  

## 2012-03-24 NOTE — Progress Notes (Signed)
Positive amnisure at 20 weeks, normal AFI on 03/02/12 ultrasound.  Reports no leaking x 3 wks, will recheck fluid (scheduled for ultrasound tomorrow) and repeat testing with Amnisure.  If positive will admit for PPROM given that she is close to viability. On exam, no pool, negative nitrazine, no ferning. Amnisure sent, will follow up results.  Follow up ultrasound tomorrow. If testing negative for PPROM, will start 17P injections next week.  PTL precautions reviewed.

## 2012-03-25 ENCOUNTER — Ambulatory Visit (HOSPITAL_COMMUNITY)
Admission: RE | Admit: 2012-03-25 | Discharge: 2012-03-25 | Disposition: A | Discharge status: Home / Self Care | Payer: Self-pay | Source: Ambulatory Visit | Attending: Family Medicine | Admitting: Family Medicine

## 2012-03-25 DIAGNOSIS — O09529 Supervision of elderly multigravida, unspecified trimester: Secondary | ICD-10-CM | POA: Insufficient documentation

## 2012-03-25 DIAGNOSIS — O34219 Maternal care for unspecified type scar from previous cesarean delivery: Secondary | ICD-10-CM | POA: Insufficient documentation

## 2012-03-25 DIAGNOSIS — O09219 Supervision of pregnancy with history of pre-term labor, unspecified trimester: Secondary | ICD-10-CM

## 2012-03-25 DIAGNOSIS — Z8751 Personal history of pre-term labor: Secondary | ICD-10-CM | POA: Insufficient documentation

## 2012-03-25 DIAGNOSIS — O4290 Premature rupture of membranes, unspecified as to length of time between rupture and onset of labor, unspecified weeks of gestation: Secondary | ICD-10-CM | POA: Insufficient documentation

## 2012-03-25 DIAGNOSIS — Z8611 Personal history of tuberculosis: Secondary | ICD-10-CM

## 2012-03-29 NOTE — Telephone Encounter (Signed)
[place] results reviewed from 6/7- AFI wnl.  Note entered on sticky note for pt to start 17P @ visit on 6/13.

## 2012-03-31 ENCOUNTER — Encounter: Payer: Self-pay | Admitting: Advanced Practice Midwife

## 2012-03-31 ENCOUNTER — Ambulatory Visit (INDEPENDENT_AMBULATORY_CARE_PROVIDER_SITE_OTHER): Payer: Self-pay | Admitting: Advanced Practice Midwife

## 2012-03-31 VITALS — BP 96/68 | Temp 97.3°F | Wt 152.6 lb

## 2012-03-31 DIAGNOSIS — O09529 Supervision of elderly multigravida, unspecified trimester: Secondary | ICD-10-CM

## 2012-03-31 DIAGNOSIS — O09219 Supervision of pregnancy with history of pre-term labor, unspecified trimester: Secondary | ICD-10-CM

## 2012-03-31 LAB — POCT URINALYSIS DIP (DEVICE)
Glucose, UA: NEGATIVE mg/dL
Hgb urine dipstick: NEGATIVE
Specific Gravity, Urine: 1.02 (ref 1.005–1.030)
Urobilinogen, UA: 0.2 mg/dL (ref 0.0–1.0)

## 2012-03-31 NOTE — Patient Instructions (Signed)
Embarazo - Segundo trimestre (Pregnancy - Second Trimester) El segundo trimestre del embarazo (del 3 al 6mes) es un perodo de evolucin rpida para usted y el beb. Hacia el final del sexto mes, el beb mide aproximadamente 23 cm y pesa 680 g. Comenzar a sentir los movimientos del beb entre las 18 y las 20 semanas de embarazo. Podr sentir las pataditas ("quickening en ingls"). Hay un rpido aumento de peso. Puede segregar un lquido claro (calostro) de las mamas. Quizs sienta pequeas contracciones en el vientre (tero) Esto se conoce como falso trabajo de parto o contracciones de Braxton-Hicks. Es como una prctica del trabajo de parto que se produce cuando el beb est listo para salir. Generalmente los problemas de vmitos matinales ya se han superado hacia el final del primer trimestre. Algunas mujeres desarrollan pequeas manchas oscuras (que se denominan cloasma, mscara del embarazo) en la cara que normalmente se van luego del nacimiento del beb. La exposicin al sol empeora las manchas. Puede desarrollarse acn en algunas mujeres embarazadas, y puede desaparecer en aquellas que ya tienen acn. EXAMENES PRENATALES  Durante los exmenes prenatales, deber seguir realizando pruebas de sangre, segn avance el embarazo. Estas pruebas se realizan para controlar su salud y la del beb. Tambin se realizan anlisis de sangre para conocer los niveles de hemoglobina. La anemia (bajo nivel de hemoglobina) es frecuente durante el embarazo. Para prevenirla, se administran hierro y vitaminas. Tambin se le realizarn exmenes para saber si tiene diabetes entre las 24 y las 28 semanas del embarazo. Podrn repetirle algunas de las pruebas que le hicieron previamente.   En cada visita le medirn el tamao del tero. Esto se realiza para asegurarse de que el beb est creciendo correctamente de acuerdo al estado del embarazo.   Tambin en cada visita prenatal controlarn su presin arterial. Esto se realiza  para asegurarse de que no tenga toxemia.   Se controlar su orina para asegurarse de que no tenga infecciones, diabetes o protena en la orina.   Se controlar su peso regularmente para asegurarse que el aumento ocurre al ritmo indicado. Esto se hace para asegurarse que usted y el beb tienen una evolucin normal.   En algunas ocasiones se realiza una prueba de ultrasonido para confirmar el correcto desarrollo y evolucin del beb. Esta prueba se realiza con ondas sonoras inofensivas para el beb, de modo que el profesional pueda calcular ms precisamente la fecha del parto.  Algunas veces se realizan pruebas especializadas del lquido amnitico que rodea al beb. Esta prueba se denomina amniocentesis. El lquido amnitico se obtiene introduciendo una aguja en el vientre (abdomen). Se realiza para controlar los cromosomas en aquellos casos en los que existe alguna preocupacin acerca de algn problema gentico que pueda sufrir el beb. En ocasiones se lleva a cabo cerca del final del embarazo, si es necesario inducir al parto. En este caso se realiza para asegurarse que los pulmones del beb estn lo suficientemente maduros como para que pueda vivir fuera del tero. CAMBIOS QUE OCURREN EN EL SEGUNDO TRIMESTRE DEL EMBARAZO Su organismo atravesar numerosos cambios durante el embarazo. Estos pueden variar de una persona a otra. Converse con el profesional que la asiste acerca los cambios que usted note y que la preocupen.  Durante el segundo trimestre probablemente sienta un aumento del apetito. Es normal tener "antojos" de ciertas comidas. Esto vara de una persona a otra y de un embarazo a otro.   El abdomen inferior comenzar a abultarse.   Podr tener la necesidad   de orinar con ms frecuencia debido a que el tero y el beb presionan sobre la vejiga. Tambin es frecuente contraer ms infecciones urinarias durante el embarazo (dolor al orinar). Puede evitarlas bebiendo gran cantidad de lquidos y  vaciando la vejiga antes y despus de mantener relaciones sexuales.   Podrn aparecer las primeras estras en las caderas, abdomen y mamas. Estos son cambios normales del cuerpo durante el embarazo. No existen medicamentos ni ejercicios que puedan prevenir estos cambios.   Es posible que comience a desarrollar venas inflamadas y abultadas (varices) en las piernas. El uso de medias de descanso, elevar sus pies durante 15 minutos, 3 a 4 veces al da y limitar la sal en su dieta ayuda a aliviar el problema.   Podr sentir acidez gstrica a medida que el tero crece y presiona contra el estmago. Puede tomar anticidos, con la autorizacin de su mdico, para aliviar este problema. Tambin es til ingerir pequeas comidas 4 a 5 veces al da.   La constipacin puede tratarse con un laxante o agregando fibra a su dieta. Beber grandes cantidades de lquidos, comer vegetales, frutas y granos integrales es de gran ayuda.   Tambin es beneficioso practicar actividad fsica. Si ha sido una persona activa hasta el embarazo, podr continuar con la mayora de las actividades durante el mismo. Si ha sido menos activa, puede ser beneficioso que comience con un programa de ejercicios, como realizar caminatas.   Puede desarrollar hemorroides (vrices en el recto) hacia el final del segundo trimestre. Tomar baos de asiento tibios y utilizar cremas recomendadas por el profesional que lo asiste sern de ayuda para los problemas de hemorroides.   Tambin podr sentir dolor de espalda durante este momento de su embarazo. Evite levantar objetos pesados, utilice zapatos de taco bajo y mantenga una buena postura para ayudar a reducir los problemas de espalda.   Algunas mujeres embarazadas desarrollan hormigueo y adormecimiento de la mano y los dedos debido a la hinchazn y compresin de los ligamentos de la mueca (sndrome del tnel carpiano). Esto desaparece una vez que el beb nace.   Como sus pechos se agrandan,  necesitar un sujetador ms grande. Use un sostn de soporte, cmodo y de algodn. No utilice un sostn para amamantar hasta el ltimo mes de embarazo si va a amamantar al beb.   Podr observar una lnea oscura desde el ombligo hacia la zona pbica denominada linea nigra.   Podr observar que sus mejillas se ponen coloradas debido al aumento de flujo sanguneo en la cara.   Podr desarrollar "araitas" en la cara, cuello y pecho. Esto desaparece una vez que el beb nace.  INSTRUCCIONES PARA EL CUIDADO DOMICILIARIO  Es extremadamente importante que evite el cigarrillo, hierbas medicinales, alcohol y las drogas no prescriptas durante el embarazo. Estas sustancias qumicas afectan la formacin y el desarrollo del beb. Evite estas sustancias durante todo el embarazo para asegurar el nacimiento de un beb sano.   La mayor parte de los cuidados que se aconsejan son los mismos que los indicados para el primer trimestre del embarazo. Cumpla con las citas tal como se le indic. Siga las instrucciones del profesional que lo asiste con respecto al uso de los medicamentos, el ejercicio y la dieta.   Durante el embarazo debe obtener nutrientes para usted y para su beb. Consuma alimentos balanceados a intervalos regulares. Elija alimentos como carne, pescado, leche y otros productos lcteos descremados, vegetales, frutas, panes integrales y cereales. El profesional le informar cul es el   aumento de peso ideal.   Las relaciones sexuales fsicas pueden continuarse hasta cerca del fin del embarazo si no existen otros problemas. Estos problemas pueden ser la prdida temprana (prematura) de lquido amnitico de las membranas, sangrado vaginal, dolor abdominal u otros problemas mdicos o del embarazo.   Realice actividad fsica todos los das, si no tiene restricciones. Consulte con el profesional que la asiste si no sabe con certeza si determinados ejercicios son seguros. El mayor aumento de peso tiene lugar  durante los ltimos 2 trimestres del embarazo. El ejercicio la ayudar a:   Controlar su peso.   Ponerla en forma para el parto.   Ayudarla a perder peso luego de haber dado a luz.   Use un buen sostn o como los que se usan para hacer deportes para aliviar la sensibilidad de las mamas. Tambin puede serle til si lo usa mientras duerme. Si pierde calostro, podr utilizar apsitos en el sostn.   No utilice la baera con agua caliente, baos turcos y saunas durante el embarazo.   Utilice el cinturn de seguridad sin excepcin cuando conduzca. Este la proteger a usted y al beb en caso de accidente.   Evite comer carne cruda, queso crudo, y el contacto con los utensilios y desperdicios de los gatos. Estos elementos contienen grmenes que pueden causar defectos de nacimiento en el beb.   El segundo trimestre es un buen momento para visitar a su dentista y evaluar su salud dental si an no lo ha hecho. Es importante mantener los dientes limpios. Utilice un cepillo de dientes blando. Cepllese ms suavemente durante el embarazo.   Es ms fcil perder algo de orina durante el embarazo. Apretar y fortalecer los msculos de la pelvis la ayudar con este problema. Practique detener la miccin cuando est en el bao. Estos son los mismos msculos que necesita fortalecer. Son tambin los mismos msculos que utiliza cuando trata de evitar los gases. Puede practicar apretando estos msculos 10 veces, y repetir esto 3 veces por da aproximadamente. Una vez que conozca qu msculos debe apretar, no realice estos ejercicios durante la miccin. Puede favorecerle una infeccin si la orina vuelve hacia atrs.   Pida ayuda si tiene necesidades econmicas, de asesoramiento o nutricionales durante el embarazo. El profesional podr ayudarla con respecto a estas necesidades, o derivarla a otros especialistas.   La piel puede ponerse grasa. Si esto sucede, lvese la cara con un jabn suave, utilice un humectante no  graso y maquillaje con base de aceite o crema.  CONSUMO DE MEDICAMENTOS Y DROGAS DURANTE EL EMBARAZO  Contine tomando las vitaminas apropiadas para esta etapa tal como se le indic. Las vitaminas deben contener un miligramo de cido flico y deben suplementarse con hierro. Guarde todas las vitaminas fuera del alcance de los nios. La ingestin de slo un par de vitaminas o tabletas que contengan hierro puede ocasionar la muerte en un beb o en un nio pequeo.   Evite el uso de medicamentos, inclusive los de venta libre y hierbas que no hayan sido prescriptos o indicados por el profesional que la asiste. Algunos medicamentos pueden causar problemas fsicos al beb. Utilice los medicamentos de venta libre o de prescripcin para el dolor, el malestar o la fiebre, segn se lo indique el profesional que lo asiste. No utilice aspirina.   El consumo de alcohol est relacionado con ciertos defectos de nacimiento. Esto incluye el sndrome de alcoholismo fetal. Debe evitar el consumo de alcohol en cualquiera de sus formas. El cigarrillo   causa nacimientos prematuros y bebs de bajo peso. El uso de drogas recreativas est absolutamente prohibido. Son muy nocivas para el beb. Un beb que nace de una madre adicta, ser adicto al nacer. Ese beb tendr los mismos sntomas de abstinencia que un adulto.   Infrmele al profesional si consume alguna droga.   No consuma drogas ilegales. Pueden causarle mucho dao al beb.  SOLICITE ATENCIN MDICA SI: Tiene preguntas o preocupaciones durante su embarazo. Es mejor que llame para consultar las dudas que esperar hasta su prxima visita prenatal. De esta forma se sentir ms tranquila.  SOLICITE ATENCIN MDICA DE INMEDIATO SI:  La temperatura oral se eleva sin motivo por encima de 102 F (38.9 C) o segn le indique el profesional que lo asiste.   Tiene una prdida de lquido por la vagina (canal de parto). Si sospecha una ruptura de las membranas, tmese la  temperatura y llame al profesional para informarlo sobre esto.   Observa unas pequeas manchas, una hemorragia vaginal o elimina cogulos. Notifique al profesional acerca de la cantidad y de cuntos apsitos est utilizando. Unas pequeas manchas de sangre son algo comn durante el embarazo, especialmente despus de mantener relaciones sexuales.   Presenta un olor desagradable en la secrecin vaginal y observa un cambio en el color, de transparente a blanco.   Contina con las nuseas y no obtiene alivio de los remedios indicados. Vomita sangre o algo similar a la borra del caf.   [place] o sube ms de 900 g. en una semana, o segn lo indicado por el profesional que la asiste.   Observa que se le hinchan el rostro, las manos, los pies o las piernas.   Ha estado expuesta a la rubola y no ha sufrido la enfermedad.   Ha estado expuesta a la quinta enfermedad o a la varicela.   Presenta dolor abdominal. Las molestias en el ligamento redondo son una causa no cancerosa (benigna) frecuente de dolor abdominal durante el embarazo. El profesional que la asiste deber evaluarla.   Presenta dolor de cabeza intenso que no se alivia.   Presenta fiebre, diarrea, dolor al orinar o le falta la respiracin.   Presenta dificultad para ver, visin borrosa, o visin doble.   Sufre una cada, un accidente de trnsito o cualquier tipo de trauma.   Vive en un hogar en el que existe violencia fsica o mental.  Document Released: 07/15/2005 Document Revised: 09/24/2011 [employer] Patient Information 2012 [employer]. 

## 2012-03-31 NOTE — Progress Notes (Signed)
Doing well. Starting 17 p today.  Amnisure neg last visit.  [place] last visit normal with cervical length 3.0, normal AFI and normal anatomy.

## 2012-03-31 NOTE — Progress Notes (Signed)
Pulse 77 Starts 17-p today  Delorise Royals used for interpreter

## 2012-04-04 ENCOUNTER — Encounter: Payer: Self-pay | Admitting: Family Medicine

## 2012-04-07 ENCOUNTER — Ambulatory Visit (INDEPENDENT_AMBULATORY_CARE_PROVIDER_SITE_OTHER): Payer: Self-pay

## 2012-04-07 VITALS — BP 92/60 | HR 72 | Ht 60.0 in | Wt 155.9 lb

## 2012-04-07 DIAGNOSIS — O09219 Supervision of pregnancy with history of pre-term labor, unspecified trimester: Secondary | ICD-10-CM

## 2012-04-14 ENCOUNTER — Ambulatory Visit (INDEPENDENT_AMBULATORY_CARE_PROVIDER_SITE_OTHER): Payer: Medicaid Other | Admitting: Family Medicine

## 2012-04-14 ENCOUNTER — Encounter: Payer: Self-pay | Admitting: Family Medicine

## 2012-04-14 VITALS — BP 96/67 | Temp 97.3°F | Wt 156.8 lb

## 2012-04-14 DIAGNOSIS — O0990 Supervision of high risk pregnancy, unspecified, unspecified trimester: Secondary | ICD-10-CM

## 2012-04-14 DIAGNOSIS — O09529 Supervision of elderly multigravida, unspecified trimester: Secondary | ICD-10-CM

## 2012-04-14 DIAGNOSIS — O4290 Premature rupture of membranes, unspecified as to length of time between rupture and onset of labor, unspecified weeks of gestation: Secondary | ICD-10-CM

## 2012-04-14 LAB — CBC
Hemoglobin: 10.7 g/dL — ABNORMAL LOW (ref 12.0–15.0)
MCH: 25.4 pg — ABNORMAL LOW (ref 26.0–34.0)
MCV: 77 fL — ABNORMAL LOW (ref 78.0–100.0)
Platelets: 211 10*3/uL (ref 150–400)
RBC: 4.21 MIL/uL (ref 3.87–5.11)

## 2012-04-14 LAB — POCT URINALYSIS DIP (DEVICE)
Bilirubin Urine: NEGATIVE
Specific Gravity, Urine: 1.02 (ref 1.005–1.030)
pH: 7 (ref 5.0–8.0)

## 2012-04-14 NOTE — Patient Instructions (Signed)
Embarazo - Segundo trimestre (Pregnancy - Second Trimester) El segundo trimestre del embarazo (del 3 al 6mes) es un perodo de evolucin rpida para usted y el beb. Hacia el final del sexto mes, el beb mide aproximadamente 23 cm y pesa 680 g. Comenzar a sentir los movimientos del beb entre las 18 y las 20 semanas de embarazo. Podr sentir las pataditas ("quickening en ingls"). Hay un rpido aumento de peso. Puede segregar un lquido claro (calostro) de las mamas. Quizs sienta pequeas contracciones en el vientre (tero) Esto se conoce como falso trabajo de parto o contracciones de Braxton-Hicks. Es como una prctica del trabajo de parto que se produce cuando el beb est listo para salir. Generalmente los problemas de vmitos matinales ya se han superado hacia el final del primer trimestre. Algunas mujeres desarrollan pequeas manchas oscuras (que se denominan cloasma, mscara del embarazo) en la cara que normalmente se van luego del nacimiento del beb. La exposicin al sol empeora las manchas. Puede desarrollarse acn en algunas mujeres embarazadas, y puede desaparecer en aquellas que ya tienen acn. EXAMENES PRENATALES  Durante los exmenes prenatales, deber seguir realizando pruebas de sangre, segn avance el embarazo. Estas pruebas se realizan para controlar su salud y la del beb. Tambin se realizan anlisis de sangre para conocer los niveles de hemoglobina. La anemia (bajo nivel de hemoglobina) es frecuente durante el embarazo. Para prevenirla, se administran hierro y vitaminas. Tambin se le realizarn exmenes para saber si tiene diabetes entre las 24 y las 28 semanas del embarazo. Podrn repetirle algunas de las pruebas que le hicieron previamente.   En cada visita le medirn el tamao del tero. Esto se realiza para asegurarse de que el beb est creciendo correctamente de acuerdo al estado del embarazo.   Tambin en cada visita prenatal controlarn su presin arterial. Esto se  realiza para asegurarse de que no tenga toxemia.   Se controlar su orina para asegurarse de que no tenga infecciones, diabetes o protena en la orina.   Se controlar su peso regularmente para asegurarse que el aumento ocurre al ritmo indicado. Esto se hace para asegurarse que usted y el beb tienen una evolucin normal.   En algunas ocasiones se realiza una prueba de ultrasonido para confirmar el correcto desarrollo y evolucin del beb. Esta prueba se realiza con ondas sonoras inofensivas para el beb, de modo que el profesional pueda calcular ms precisamente la fecha del parto.  Algunas veces se realizan pruebas especializadas del lquido amnitico que rodea al beb. Esta prueba se denomina amniocentesis. El lquido amnitico se obtiene introduciendo una aguja en el vientre (abdomen). Se realiza para controlar los cromosomas en aquellos casos en los que existe alguna preocupacin acerca de algn problema gentico que pueda sufrir el beb. En ocasiones se lleva a cabo cerca del final del embarazo, si es necesario inducir al parto. En este caso se realiza para asegurarse que los pulmones del beb estn lo suficientemente maduros como para que pueda vivir fuera del tero. CAMBIOS QUE OCURREN EN EL SEGUNDO TRIMESTRE DEL EMBARAZO Su organismo atravesar numerosos cambios durante el embarazo. Estos pueden variar de una persona a otra. Converse con el profesional que la asiste acerca los cambios que usted note y que la preocupen.  Durante el segundo trimestre probablemente sienta un aumento del apetito. Es normal tener "antojos" de ciertas comidas. Esto vara de una persona a otra y de un embarazo a otro.   El abdomen inferior comenzar a abultarse.   Podr tener la necesidad   de orinar con ms frecuencia debido a que el tero y el beb presionan sobre la vejiga. Tambin es frecuente contraer ms infecciones urinarias durante el embarazo (dolor al orinar). Puede evitarlas bebiendo gran cantidad de lquidos y  vaciando la vejiga antes y despus de mantener relaciones sexuales.   Podrn aparecer las primeras estras en las caderas, abdomen y mamas. Estos son cambios normales del cuerpo durante el embarazo. No existen medicamentos ni ejercicios que puedan prevenir estos cambios.   Es posible que comience a desarrollar venas inflamadas y abultadas (varices) en las piernas. El uso de medias de descanso, elevar sus pies durante 15 minutos, 3 a 4 veces al da y limitar la sal en su dieta ayuda a aliviar el problema.   Podr sentir acidez gstrica a medida que el tero crece y presiona contra el estmago. Puede tomar anticidos, con la autorizacin de su mdico, para aliviar este problema. Tambin es til ingerir pequeas comidas 4 a 5 veces al da.   La constipacin puede tratarse con un laxante o agregando fibra a su dieta. Beber grandes cantidades de lquidos, comer vegetales, frutas y granos integrales es de gran ayuda.   Tambin es beneficioso practicar actividad fsica. Si ha sido una persona activa hasta el embarazo, podr continuar con la mayora de las actividades durante el mismo. Si ha sido menos activa, puede ser beneficioso que comience con un programa de ejercicios, como realizar caminatas.   Puede desarrollar hemorroides (vrices en el recto) hacia el final del segundo trimestre. Tomar baos de asiento tibios y utilizar cremas recomendadas por el profesional que lo asiste sern de ayuda para los problemas de hemorroides.   Tambin podr sentir dolor de espalda durante este momento de su embarazo. Evite levantar objetos pesados, utilice zapatos de taco bajo y mantenga una buena postura para ayudar a reducir los problemas de espalda.   Algunas mujeres embarazadas desarrollan hormigueo y adormecimiento de la mano y los dedos debido a la hinchazn y compresin de los ligamentos de la mueca (sndrome del tnel carpiano). Esto desaparece una vez que el beb nace.   Como sus pechos se agrandan,  necesitar un sujetador ms grande. Use un sostn de soporte, cmodo y de algodn. No utilice un sostn para amamantar hasta el ltimo mes de embarazo si va a amamantar al beb.   Podr observar una lnea oscura desde el ombligo hacia la zona pbica denominada linea nigra.   Podr observar que sus mejillas se ponen coloradas debido al aumento de flujo sanguneo en la cara.   Podr desarrollar "araitas" en la cara, cuello y pecho. Esto desaparece una vez que el beb nace.  INSTRUCCIONES PARA EL CUIDADO DOMICILIARIO  Es extremadamente importante que evite el cigarrillo, hierbas medicinales, alcohol y las drogas no prescriptas durante el embarazo. Estas sustancias qumicas afectan la formacin y el desarrollo del beb. Evite estas sustancias durante todo el embarazo para asegurar el nacimiento de un beb sano.   La mayor parte de los cuidados que se aconsejan son los mismos que los indicados para el primer trimestre del embarazo. Cumpla con las citas tal como se le indic. Siga las instrucciones del profesional que lo asiste con respecto al uso de los medicamentos, el ejercicio y la dieta.   Durante el embarazo debe obtener nutrientes para usted y para su beb. Consuma alimentos balanceados a intervalos regulares. Elija alimentos como carne, pescado, leche y otros productos lcteos descremados, vegetales, frutas, panes integrales y cereales. El profesional le informar cul es el   aumento de peso ideal.   Las relaciones sexuales fsicas pueden continuarse hasta cerca del fin del embarazo si no existen otros problemas. Estos problemas pueden ser la prdida temprana (prematura) de lquido amnitico de las membranas, sangrado vaginal, dolor abdominal u otros problemas mdicos o del embarazo.   Realice actividad fsica todos los das, si no tiene restricciones. Consulte con el profesional que la asiste si no sabe con certeza si determinados ejercicios son seguros. El mayor aumento de peso tiene  lugar durante los ltimos 2 trimestres del embarazo. El ejercicio la ayudar a:   Controlar su peso.   Ponerla en forma para el parto.   Ayudarla a perder peso luego de haber dado a luz.   Use un buen sostn o como los que se usan para hacer deportes para aliviar la sensibilidad de las mamas. Tambin puede serle til si lo usa mientras duerme. Si pierde calostro, podr utilizar apsitos en el sostn.   No utilice la baera con agua caliente, baos turcos y saunas durante el embarazo.   Utilice el cinturn de seguridad sin excepcin cuando conduzca. Este la proteger a usted y al beb en caso de accidente.   Evite comer carne cruda, queso crudo, y el contacto con los utensilios y desperdicios de los gatos. Estos elementos contienen grmenes que pueden causar defectos de nacimiento en el beb.   El segundo trimestre es un buen momento para visitar a su dentista y evaluar su salud dental si an no lo ha hecho. Es importante mantener los dientes limpios. Utilice un cepillo de dientes blando. Cepllese ms suavemente durante el embarazo.   Es ms fcil perder algo de orina durante el embarazo. Apretar y fortalecer los msculos de la pelvis la ayudar con este problema. Practique detener la miccin cuando est en el bao. Estos son los mismos msculos que necesita fortalecer. Son tambin los mismos msculos que utiliza cuando trata de evitar los gases. Puede practicar apretando estos msculos 10 veces, y repetir esto 3 veces por da aproximadamente. Una vez que conozca qu msculos debe apretar, no realice estos ejercicios durante la miccin. Puede favorecerle una infeccin si la orina vuelve hacia atrs.   Pida ayuda si tiene necesidades econmicas, de asesoramiento o nutricionales durante el embarazo. El profesional podr ayudarla con respecto a estas necesidades, o derivarla a otros especialistas.   La piel puede ponerse grasa. Si esto sucede, lvese la cara con un jabn suave, utilice un  humectante no graso y maquillaje con base de aceite o crema.  CONSUMO DE MEDICAMENTOS Y DROGAS DURANTE EL EMBARAZO  Contine tomando las vitaminas apropiadas para esta etapa tal como se le indic. Las vitaminas deben contener un miligramo de cido flico y deben suplementarse con hierro. Guarde todas las vitaminas fuera del alcance de los nios. La ingestin de slo un par de vitaminas o tabletas que contengan hierro puede ocasionar la muerte en un beb o en un nio pequeo.   Evite el uso de medicamentos, inclusive los de venta libre y hierbas que no hayan sido prescriptos o indicados por el profesional que la asiste. Algunos medicamentos pueden causar problemas fsicos al beb. Utilice los medicamentos de venta libre o de prescripcin para el dolor, el malestar o la fiebre, segn se lo indique el profesional que lo asiste. No utilice aspirina.   El consumo de alcohol est relacionado con ciertos defectos de nacimiento. Esto incluye el sndrome de alcoholismo fetal. Debe evitar el consumo de alcohol en cualquiera de sus formas. El cigarrillo   causa nacimientos prematuros y bebs de [place]. El uso de drogas recreativas est absolutamente prohibido. Son muy nocivas para el beb. Un beb que nace de [employer], ser adicto al nacer. Ese beb tendr los mismos sntomas de abstinencia que un adulto.   Infrmele al profesional si consume alguna droga.   No consuma drogas ilegales. Pueden causarle mucho dao al beb.  SOLICITE ATENCIN MDICA SI: Tiene preguntas o preocupaciones durante su embarazo. Es mejor que llame para Science writer las dudas que esperar hasta su prxima visita prenatal. Thressa Sheller forma se sentir ms tranquila.  SOLICITE ATENCIN MDICA DE INMEDIATO SI:  La temperatura oral se eleva sin motivo por encima de 102 F (38.9 C) o segn le indique el profesional que lo asiste.   Tiene una prdida de lquido por la vagina (canal de parto). Si sospecha una ruptura de las [place],  tmese la temperatura y llame al profesional para informarlo sobre esto.   Observa unas pequeas manchas, una hemorragia vaginal o elimina cogulos. Notifique al profesional acerca de la cantidad y de cuntos apsitos est utilizando. Unas pequeas manchas de sangre son algo comn durante el Psychiatrist, especialmente despus de Sales promotion account executive.   Presenta un olor desagradable en la secrecin vaginal y observa un cambio en el color, de transparente a blanco.   Contina con las nuseas y no obtiene alivio de los remedios indicados. Vomita sangre o algo similar a la borra del caf.   [place] o sube ms de 900 g. en una semana, o segn lo indicado por el profesional que la asiste.   Observa que se le [employer], las manos, los pies o las piernas.   Ha estado expuesta a la rubola y no ha sufrido la enfermedad.   Ha estado expuesta a la quinta enfermedad o a la varicela.   Presenta dolor abdominal. Las molestias en el ligamento redondo son Neomia Dear causa no cancerosa (benigna) frecuente de dolor abdominal durante el embarazo. El profesional que la asiste deber evaluarla.   Presenta dolor de cabeza intenso que no se [place].   Presenta fiebre, diarrea, dolor al orinar o le falta la respiracin.   Presenta dificultad para ver, visin borrosa, o visin doble.   Sufre una cada, un accidente de trnsito o cualquier tipo de trauma.   Vive en un hogar en el que existe violencia fsica o mental.  Document Released: 07/15/2005 Document Revised: 09/24/2011 [facility] Patient Information 2012 [place], [place]. Amamantar al beb (Breastfeeding) LOS BENEFICIOS DE AMAMANTAR Para el beb  La primera leche (calostro ) ayuda al mejor funcionamiento del sistema digestivo del beb.   La leche tiene anticuerpos que provienen de la madre y que ayudan a prevenir las infecciones en el beb.   Hay una menor incidencia de asma, enfermedades alrgicas y SMSI (sndrome de muerte sbita nfantil).    Los nutrientes que contiene la [place] materna son mejores que las frmulas para el bibern y favorecen el desarrollo cerebral.   Los bebs amamantados sufren menos gases, clicos y constipacin.  Para la mam  La lactancia materna favorece el desarrollo de un vnculo muy especial entre la madre y el beb.   Es ms conveniente, siempre disponible a la Optician, dispensing y ms econmica que la [employer]   Consume caloras en la madre y la ayuda a perder el peso ganado durante el [place].   Favorece la contraccin del tero a su tamao normal, de manera ms rpida y [employer] las hemorragias luego del [place].  Las [employer] que amamantan tienen menor riesgo de Geophysical data processor de mama.  AMAMNTELO CON FRECUENCIA  Un beb sano, nacido a trmino, puede amamantarse con tanta frecuencia como cada hora, o espaciar las comidas cada tres horas.   Esta frecuencia variar de un beb a otro. Observe al beb cuando manifieste signos de hambre, antes que regirse por el reloj.   Amamntelo tan seguido como el beb lo solicite, o cuando usted sienta la necesidad de Paramedic sus [place].   Despierte al beb si han pasado 3  4 horas desde la ltima comida.   El amamantamiento frecuente la ayudar a producir ms [place] y a Education officer, community de Engineer, mining en los pezones e hinchazn de las [place].  LA POSICIN DEL BEB PARA AMAMANTARLO  Ya sea que se encuentre acostada o sentada, asegrese que el abdomen del beb enfrente el suyo.   Sostenga la mama con el pulgar por arriba y el resto de los dedos por debajo. Asegrese que sus dedos se encuentren lejos del pezn y de la boca del beb.   Toque suavemente los labios del beb y la mejilla ms cercana a la mama con el dedo o el pezn.   Cuando la boca del beb se abra lo suficiente, introduzca el pezn y la zona oscura que lo rodea tanto como le sea posible dentro de la boca.   Coloque a beb cerca suyo de modo que su nariz y mejillas toquen las mamas  al [employer].  LAS COMIDAS  La duracin de cada comida vara de un beb a otro y de [place] comida a [place].   El beb debe succionar alrededor [employer] o tres minutos para que le llegue [place]. Esto se denomina "bajada". Por este motivo, permita que el nio se alimente en cada mama todo lo que desee. Terminar de mamar cuando haya recibido la cantidad [place] de nutrientes.   Para detener la succin coloque su dedo en la comisura de la boca del nio y Midwife entre sus encas antes de quitarle la mama de la boca. Esto la ayudar a English as a second language teacher.  REDUCIR LA CONGESTIN DE LAS MAMAS  Durante la primera semana despus del parto, usted puede experimentar [employer]. Cuando las mamas estn congestionadas, se sienten calientes, llenas y molestas al tacto. Puede reducir la congestin si:   Lo amamanta frecuentemente, cada 2-3 horas. Las mams que [employer] pronto y con frecuencia tienen menos problemas de [place].   Coloque bolsas fras livianas entre cada [place]. Esto ayuda a Building services engineer. Envuelva las bolsas de hielo en una toalla liviana para proteger su piel.   Aplique compresas hmedas calientes [employer] durante 5 a 10 minutos antes de amamantar al [employer]. Esto aumenta la circulacin y [place] a que la [place].   [place]ee suavemente la mama antes y Psychologist, sport and exercise.   Asegrese que el nio vaca al menos una mama antes de cambiar de lado.   Use un sacaleche para vaciar la mama si el beb se duerme o no se alimenta bien. Tambin podr [employer] con esta bomba si tiene que volver al trabajo o siente que las mamas estn congestionadas.   Evite los biberones, chupetes o complementar la alimentacin con agua o jugos en lugar de la [place].   Verifique que el beb se encuentra en la posicin correcta mientras lo alimenta.   Evite el cansancio, el estrs y la anemia   Use un soutien que sostenga  bien sus mamas y [employer] que tienen aro.    Consuma una dieta balanceada y beba lquidos en cantidad.  Si sigue estas indicaciones, la congestin debe mejorar en 24 a 48 horas. Si an tiene dificultades, consulte a Barista. TENDR SUFICIENTE LECHE MI BEB? Algunas veces las madres se preocupan acerca de si sus bebs tendrn la leche suficiente. Puede asegurarse que el beb tiene la leche suficiente si:  El beb succiona y escucha que traga activamente.   El nio se alimenta al menos 8 a 12 veces en 24 horas. Alimntelo hasta que se desprenda por sus propios medios o se quede dormido en la primera mama (al menos durante 10 a 20 minutos), luego ofrzcale el otro lado.   El beb moja 5 a 6 paales descartables (6 a 8 paales de tela) en 24 horas cuando tiene 5  6 das de vida.   Tiene al menos 2-3 deposiciones todos los [employer] primeros meses. La leche materna es todo el alimento que el beb necesita. No es necesario que el nio ingiera agua o preparados de bibern. De hecho, para ayudar a que sus mamas produzcan ms [place], lo mejor es no darle al beb suplementos durante las primeras semanas.   La materia fecal debe ser blanda y [place].   El beb debe aumentar 112 a 196 g por semana.  CUDESE Cuide sus mamas del siguiente modo:  Bese o dchese diariamente.   No lave sus pezones con jabn.   Comience a amamantar del lado izquierdo en una comida y del lado derecho en la siguiente.   Notar que [employer] suministro de [place] a los 2 a 5 [address]  [address] despus del [place]. Puede sentir algunas molestias por la congestin, lo que hace que sus mamas estn duras y sensibles. La congestin disminuye en 24 a 48 horas. Mientras tanto, aplique toallas hmedas calientes durante 5 a 10 minutos antes de amamantar. Un masaje suave y la extraccin de un poco de leche antes de Museum/gallery exhibitions officer ablandarn las mamas y har ms fcil que el beb se agarre. Use un buen sostn y seque al aire los pezones durante 10 a 15 minutos luego de cada  alimentacin.   Solo utilice apsitos de algodn.   Utilice lanolina [employer] pezones luego de [place]. No necesita lavarlos luego de alimentar al [employer].  Cudese del siguiente modo:   Consuma alimentos bien balanceados y refrigerios nutritivos.   Dixie Dials, jugos de fruta y agua para Warehouse manager sed (alrededor de 8 vasos por Futures trader).   Descanse lo suficiente.   Aumente la ingesta de calcio en la dieta (1200mg /da).   Evite los alimentos que usted nota que puedan afectar al beb.  SOLICITE ATENCIN MDICA SI:  Tiene preguntas que formular o dificultades con la alimentacin a pecho.   Necesita ayuda.   Observa una zona dura, roja y que le duele en la zona de la mama, y se acompaa de fiebre de 100.5 F (38.1 C) o ms.   El beb est muy somnoliento como para alimentarse bien o tiene problemas para dormir.   El beb moja menos de 6 paales por da, a partir de los [address] de [place].   La piel del beb o la parte blanca de sus ojos est ms amarilla de lo que estaba en el hospital.   Se siente deprimida.  Document Released: 10/05/2005 Document Revised: 09/24/2011 [facility] Patient Information 2012 [place], [place].

## 2012-04-14 NOTE — Progress Notes (Signed)
Doing well--no evid. Of ROM Cont. 17P 28 wk labs today

## 2012-04-14 NOTE — Progress Notes (Signed)
Pulse- 87 

## 2012-04-20 ENCOUNTER — Ambulatory Visit (INDEPENDENT_AMBULATORY_CARE_PROVIDER_SITE_OTHER): Payer: Medicaid Other | Admitting: Obstetrics and Gynecology

## 2012-04-20 VITALS — BP 105/70 | HR 67 | Wt 156.6 lb

## 2012-04-20 DIAGNOSIS — O09219 Supervision of pregnancy with history of pre-term labor, unspecified trimester: Secondary | ICD-10-CM

## 2012-04-20 DIAGNOSIS — O4290 Premature rupture of membranes, unspecified as to length of time between rupture and onset of labor, unspecified weeks of gestation: Secondary | ICD-10-CM

## 2012-04-25 ENCOUNTER — Encounter: Payer: Self-pay | Admitting: Family Medicine

## 2012-04-28 ENCOUNTER — Ambulatory Visit (INDEPENDENT_AMBULATORY_CARE_PROVIDER_SITE_OTHER): Payer: Self-pay | Admitting: Physician Assistant

## 2012-04-28 VITALS — BP 111/67 | Temp 96.8°F | Wt 158.9 lb

## 2012-04-28 DIAGNOSIS — O09529 Supervision of elderly multigravida, unspecified trimester: Secondary | ICD-10-CM

## 2012-04-28 DIAGNOSIS — O34219 Maternal care for unspecified type scar from previous cesarean delivery: Secondary | ICD-10-CM

## 2012-04-28 DIAGNOSIS — O0990 Supervision of high risk pregnancy, unspecified, unspecified trimester: Secondary | ICD-10-CM

## 2012-04-28 DIAGNOSIS — O09219 Supervision of pregnancy with history of pre-term labor, unspecified trimester: Secondary | ICD-10-CM

## 2012-04-28 LAB — POCT URINALYSIS DIP (DEVICE)
Glucose, UA: NEGATIVE mg/dL
Nitrite: NEGATIVE
Protein, ur: NEGATIVE mg/dL
Urobilinogen, UA: 0.2 mg/dL (ref 0.0–1.0)
pH: 6 (ref 5.0–8.0)

## 2012-04-28 NOTE — Patient Instructions (Signed)
Embarazo - Tercer trimestre (Pregnancy - Third Trimester) El tercer trimestre del embarazo (los ltimos 3 meses) es el perodo de cambios ms rpidos que atraviesan usted y el beb. El aumento de peso es ms rpido. El beb alcanza un largo de aproximadamente 50 cm (20 pulgadas) y pesa entre 2,700 y 4,500 kg (6 a 10 libras). El beb gana ms tejido graso y ya est listo para la vida fuera del cuerpo de la madre. Mientras estn en el interior, los bebs tienen perodos de sueo y vigilia, succionan el pulgar y tienen hipo. Quizs sienta pequeas contracciones del tero. Este es el falso trabajo de parto. Tambin se las conoce como contracciones de Braxton-Hicks. Es como una prctica del parto. Los problemas ms habituales de esta etapa del embarazo incluyen mayor dificultad para respirar, hinchazn de las manos y los pies por retencin de lquidos y la necesidad de orinar con ms frecuencia debido a que el tero y el beb presionan sobre la vejiga.  EXAMENES PRENATALES  Durante los exmenes prenatales, deber seguir realizando pruebas de sangre, segn avance el embarazo. Estas pruebas se realizan para controlar su salud y la del beb. Tambin se realizan anlisis de sangre para conocer los niveles de hemoglobina. La anemia (bajo nivel de hemoglobina) es frecuente durante el embarazo. Para prevenirla, se administran hierro y vitaminas. Tambin le harn nuevas pruebas para descartar la diabetes. Podrn repetirle algunas de las pruebas que le hicieron previamente.   En cada visita le medirn el tamao del tero. Es para asegurarse de que el beb se desarrolla correctamente.   Tambin en cada visita la pesarn. Esto se realiza para asegurarse de que aumenta de peso al ritmo indicado y que usted y su beb evolucionan normalmente.   En algunas ocasiones se realiza una ecografa para confirmar el correcto desarrollo y evolucin del beb. Esta prueba se realiza con ondas sonoras inofensivas para el beb, de modo  que el profesional pueda calcular con ms precisin la fecha del parto.   Discuta las posibilidades de la anestesia si necesita cesrea.  Algunas veces se realizan pruebas especializadas del lquido amnitico que rodea al beb. Esta prueba se denomina amniocentesis. El lquido amnitico se obtiene introduciendo una aguja en el abdomen (vientre). En ocasiones se lleva a cabo cerca del final del embarazo, si es necesario adelantar el parto. En este caso se realiza para asegurarse de que los pulmones del beb estn lo suficientemente maduros como para que pueda vivir fuera del tero. CAMBIOS QUE OCURREN EN EL TERCER TRIMESTRE DEL EMBARAZO Su organismo atravesar diferentes cambios durante el embarazo que varan de una persona a otra. Converse con el profesional que la asiste acerca los cambios que usted note y que la preocupen.  Durante el ltimo trimestre probablemente sienta un aumento del apetito. Es normal tener "antojos" de ciertas comidas. Esto vara de una persona a otra y de un embarazo a otro.   Podrn aparecer las primeras estras en las caderas, abdomen y mamas. Estos son cambios normales del cuerpo durante el embarazo. No existen medicamentos ni ejercicios que puedan prevenir estos cambios.   El estreimiento puede tratarse con un laxante o agregando fibra a su dieta. Beber grandes cantidades de lquidos, tomar fibras en forma de verduras, frutas y granos integrales es de gran ayuda.   Tambin es beneficioso practicar actividad fsica. Si ha sido una persona activa hasta el embarazo, podr continuar con la mayora de las actividades durante el mismo. Si ha sido menos activa, puede ser beneficioso   que comience con un programa de ejercicios, como realizar caminatas. Consulte con el profesional que la asiste antes de comenzar un programa de ejercicios.   Evite el consumo de cigarrillos, el alcohol, los medicamentos no prescritos y las "drogas de la calle" durante el embarazo. Estas sustancias  qumicas afectan la formacin y el desarrollo del beb. Evite estas sustancias durante todo el embarazo para asegurar el nacimiento de un beb sano.   Dolor de espalda, venas varicosas y hemorroides podran aparecer o empeorar.   Los movimientos del beb pueden ser ms bruscos y aparecer ms a menudo.   Puede que note dificultades para respirar facilmente.   El ombligo podra salrsele hacia afuera.   Puede segregar un lquido amarillento (calostro) de las mamas.   Puede segregar mucus con sangre. Esto normalmente ocurre unos pocos das a una semana antes de que comience el trabajo de parto.  INSTRUCCIONES PARA EL CUIDADO DOMICILIARIO  La mayor parte de los cuidados que se aconsejan son los mismos que los indicados para las primeras etapas del embarazo. Es importante que concurra a todas las citas con el profesional y siga sus instrucciones con respecto a los medicamentos que deba utilizar, a la actividad fsica y a la dieta.   Durante el embarazo debe obtener nutrientes para usted y para su beb. Consuma alimentos balanceados a intervalos regulares. Elija alimentos como carne, pescado, leche y otros productos lcteos descremados, verduras, frutas, panes integrales y cereales. El profesional le informar cul es el aumento de peso ideal.   Las relaciones sexuales pueden continuarse hasta casi el final del embarazo, si no se presentan otros problemas como prdida prematura (antes de tiempo) de lquido amnitico, hemorragia vaginal o dolor abdominal (en el vientre).   Realice actividad fsica todos los das, si no tiene restricciones. Consulte con el profesional que la asiste si no sabe con certeza si determinados ejercicios son seguros. El mayor aumento de peso se produce en los dos ltimos trimestres del embarazo.   Haga reposo con frecuencia, con las piernas elevadas, o segn lo necesite para evitar los calambres y el dolor de cintura.   Use un buen sostn o como los que se usan para hacer  deportes para aliviar la sensibilidad de las mamas. Tambin puede serle til si lo usa mientras duerme. Si pierde calostro, podr utilizar apsitos en el sostn.   No utilice la baera con agua caliente, baos turcos y saunas.   Colquese el cinturn de seguridad cuando conduzca. Este la proteger a usted y al beb en caso de accidente.   Evite comer carne cruda y el contacto con los utensilios y desperdicios de los gatos. Estos elementos contienen grmenes que pueden causar defectos de nacimiento en el beb.   Es fcil perder algo de orina durante el embarazo. Apretar y fortalecer los msculos de la pelvis la ayudar con este problema. Practique detener la miccin cuando est en el bao. Estos son los mismos msculos que necesita fortalecer. Son tambin los mismos msculos que utiliza cuando trata de evitar los gases. Puede practicar apretando estos msculos diez veces, y repetir esto tres veces por da aproximadamente. Una vez que conozca qu msculos debe contraer, no realice estos ejercicios durante la miccin. Puede favorecerle una infeccin si la orina vuelve hacia atrs.   Pida ayuda si tiene necesidades econmicas, de asesoramiento o nutricionales durante el embarazo. El profesional podr ayudarla con respecto a estas necesidades, o derivarla a otros especialistas.   Practique la ida hasta el hospital a modo   de prueba.   Tome clases prenatales junto con su pareja para comprender, practicar y hacer preguntas acerca del trabajo de parto y el nacimiento.   Prepare la habitacin del beb.   No viaje fuera de la ciudad a menos que sea absolutamente necesario y con el consejo del mdico.   Use slo zapatos bajos sin taco para tener un mejor equilibrio y prevenir cadas.  EL CONSUMO DE MEDICAMENTOS Y DROGAS DURANTE EL EMBARAZO  Contine tomando las vitaminas apropiadas para esta etapa tal como se le indic. Las vitaminas deben contener un miligramo de cido flico y deben suplementarse con  hierro. Guarde todas las vitaminas fuera del alcance de los nios. La ingestin de slo un par de vitaminas o comprimidos que contengan hierro pueden ocasionar la muerte en un beb o en un nio pequeo.   Evite el uso de medicamentos, inclusive los de venta libre, que no hayan sido prescritos o indicados por el profesional que la asiste. Algunos medicamentos pueden causar problemas fsicos al beb. Utilice los medicamentos de venta libre o de prescripcin para el dolor, el malestar o la fiebre, segn se lo indique el profesional que lo asiste. No utilice aspirina, ibuprofeno (Motrin, Advil, Nuprin) o naproxeno (Aleve) a menos que el profesional la autorice.   El alcohol se asocia a cierto nmero de defectos del nacimiento, incluido el sndrome de alcoholismo fetal. Debe evitar el consumo de alcohol en cualquiera de sus formas. El cigarrillo causa nacimientos prematuros y bebs de bajo peso al nacer. Las drogas de la calle son muy nocivas para el beb y estn absolutamente prohibidas. Un beb que nace de una madre adicta, ser adicto al nacer. Ese beb tendr los mismos sntomas de abstinencia que un adulto.   Infrmele al profesional si consume alguna droga.  SOLICITE ATENCIN MDICA SI: Tiene alguna preocupacin durante el embarazo. Es mejor que llame para formular las preguntas si no puede esperar hasta la prxima visita, que sentirse preocupada por ellas.  DECISIONES ACERCA DE LA CIRCUNCISIN Usted puede saber o no cul es el sexo de su beb. Si es un varn, ste es el momento de pensar acerca de la circuncisin. La circuncisin es la extirpacin del prepucio. Esta es la piel que cubre el extremo sensible del pene. No hay un motivo mdico que lo justifique. Generalmente la decisin se toma segn lo que sea popular en ese momento, o se basa en creencias religiosas. Podr conversar estos temas con el profesional que la asiste. SOLICITE ATENCIN MDICA DE INMEDIATO SI:  La temperatura oral se eleva  sin motivo por encima de 102 F (38.9 C) o segn le indique el profesional que la asiste.   Tiene una prdida de lquido por la vagina (canal de parto). Si sospecha una ruptura de las membranas, tmese la temperatura y llame al profesional para informarlo sobre esto.   Observa unas pequeas manchas, una hemorragia vaginal o elimina cogulos. Avsele al profesional acerca de la cantidad y de cuntos apsitos est utilizando.   Presenta un olor desagradable en la secrecin vaginal y observa un cambio en el color, de transparente a blanco.   Ha vomitado durante ms de 24 horas.   Presenta escalofros o fiebre.   Comienza a sentir falta de aire.   Siente ardor al orinar.   Baja o sube ms de 900 g (ms de 2 libras), o segn lo indicado por el profesional que la asiste. Observa que sbitamente se le hinchan el rostro, las manos, los pies o las   piernas.   Presenta dolor abdominal. Las molestias en el ligamento redondo son una causa benigna (no cancerosa) frecuente de dolor abdominal durante el embarazo, pero el profesional que la asiste deber evaluarlo.   Presenta dolor de cabeza intenso que no se alivia.   Si no siente los movimientos del beb durante ms de tres horas. Si piensa que el beb no se mueve tanto como lo haca habitualmente, coma algo que contenga azcar y recustese sobre el lado izquierdo durante una hora. El beb debe moverse al menos 4  5 veces por hora. Comunquese inmediatamente si el beb se mueve menos que lo indicado.   Se cae, se ve involucrada en un accidente automovilstico o sufre algn tipo de traumatismo.   En su hogar hay violencia mental o fsica.  Document Released: 07/15/2005 Document Revised: 09/24/2011 [employer] Patient Information 2012 [employer]. 

## 2012-04-28 NOTE — Progress Notes (Signed)
Pulse: 93

## 2012-05-05 ENCOUNTER — Ambulatory Visit (INDEPENDENT_AMBULATORY_CARE_PROVIDER_SITE_OTHER): Payer: Self-pay | Admitting: Medical

## 2012-05-05 DIAGNOSIS — O09219 Supervision of pregnancy with history of pre-term labor, unspecified trimester: Secondary | ICD-10-CM

## 2012-05-05 DIAGNOSIS — R350 Frequency of micturition: Secondary | ICD-10-CM

## 2012-05-05 LAB — POCT URINALYSIS DIP (DEVICE)
Bilirubin Urine: NEGATIVE
Leukocytes, UA: NEGATIVE
Nitrite: NEGATIVE
Protein, ur: 30 mg/dL — AB
Urobilinogen, UA: 0.2 mg/dL (ref 0.0–1.0)
pH: 6.5 (ref 5.0–8.0)

## 2012-05-05 NOTE — Progress Notes (Signed)
Patient ID: [name], female   DOB: [birth year], 36 y.o.   MRN: [id number]  Patient states that she is having urinary frequency without urgency or burning or additional UTI symptoms. She will give a urine sample today to ensure no signs of UTI.   Joseph Berkshire, CMA

## 2012-05-12 ENCOUNTER — Ambulatory Visit (INDEPENDENT_AMBULATORY_CARE_PROVIDER_SITE_OTHER): Payer: Self-pay | Admitting: Obstetrics & Gynecology

## 2012-05-12 VITALS — BP 119/73 | Temp 97.0°F | Wt 161.2 lb

## 2012-05-12 DIAGNOSIS — Z23 Encounter for immunization: Secondary | ICD-10-CM

## 2012-05-12 DIAGNOSIS — O09529 Supervision of elderly multigravida, unspecified trimester: Secondary | ICD-10-CM

## 2012-05-12 DIAGNOSIS — O09219 Supervision of pregnancy with history of pre-term labor, unspecified trimester: Secondary | ICD-10-CM

## 2012-05-12 DIAGNOSIS — O0990 Supervision of high risk pregnancy, unspecified, unspecified trimester: Secondary | ICD-10-CM

## 2012-05-12 DIAGNOSIS — O34219 Maternal care for unspecified type scar from previous cesarean delivery: Secondary | ICD-10-CM

## 2012-05-12 LAB — POCT URINALYSIS DIP (DEVICE)
Leukocytes, UA: NEGATIVE
Nitrite: NEGATIVE
Protein, ur: 30 mg/dL — AB
Urobilinogen, UA: 0.2 mg/dL (ref 0.0–1.0)
pH: 6 (ref 5.0–8.0)

## 2012-05-12 MED ORDER — TETANUS-DIPHTH-ACELL PERTUSSIS 5-2.5-18.5 LF-MCG/0.5 IM SUSP
0.5000 mL | Freq: Once | INTRAMUSCULAR | Status: AC
  Administered 2012-05-12: 0.5 mL via INTRAMUSCULAR

## 2012-05-12 NOTE — Progress Notes (Signed)
P=75, Used Web designer.

## 2012-05-12 NOTE — Patient Instructions (Signed)
Parto prematuro  (Preterm Labor) El parto prematuro comienza antes de la semana 37 de embarazo. La duracin de un embarazo normal es de 39 a 41 semanas.  CAUSAS  Generalmente no hay una causa que pueda identificarse. Sin embargo, una de las causas conocidas ms frecuentes son las infecciones. Las infecciones del tero, el cuello, la vagina, el lquido amnitico, la vejiga, los riones y hasta de los pulmones (neumona) pueden hacer que el trabajo de parto se inicie. Otras causas son:   Infecciones urogenitales, como infecciones por hongos y vaginosis bacteriana.   Anormalidades uterinas (forma del tero, sptum uterino, fibromas, hemorragias en la placenta).   Un cuello que ha sido operado y se abre prematuramente.   Malformaciones del beb.   Gestaciones mltiples (mellizos, trillizos y ms).   Ruptura del saco amnitico.  :Otros factores de riesgo del parto prematuro son   Historia previa de parto prematuro.   Ruptura prematura de las membranas.   La placenta cubre la apertura del cuello (placenta previa).   La placenta se separa del tero (abrupcin placentaria).   El cuello es demasiado dbil para contener al beb en el tero (cuello incompetente).   Hay mucho lquido en el saco amnitico (polihidramnios).   Consumo de drogas o hbito de fumar durante el embarazo.   No aumentar de peso lo suficiente durante el embarazo.   Mujeres menores de 18 aos o mayores de 35 aos aos.   Nivel socioeconmico bajo.   Raza afroamericana.  SNTOMAS  Los signos y sntomas son:   Clicos del tipo menstrual   Contracciones con un intervalo entre 30 y 70 segundos, comienzan a ser regulares, se hacen ms frecuentes y se hacen ms intensas y dolorosas.   Contracciones que comienzan en la parte superior del tero y se expanden hacia abajo, hacia la zona inferior del abdomen y la espalda.   Sensacin de presin en la pelvis o dolor en la espalda.   Aparece una secrecin acuosa o  sanguinolenta por la vagina.  DIAGNSTICO  El diagnstico puede confirmarse:   Con un examen vaginal.   Ecografa del cuello.   Muestra (hisopado) de las secreciones crvico-vaginales. Estas muestras se analizan para buscar la presencia de fibronectina fetal. Esta protena que se encuentra en las secreciones del tero y se asocia con el parto prematuro.   Monitoreo fetal  TRATAMIENTO  Segn el tiempo del embarazo y otras circunstancias, el mdico puede indicar reposo en cama. Si es necesario, le indicarn medicamentos para detener las contracciones y apurar la maduracin de los pulmones del feto. Si el trabajo de parto se inicia antes de las 34 semanas de embarazo, se recomienda la hospitalizacin. El tratamiento depende de las condiciones en que se encuentre la madre y el beb.  PREVENCIN  Hay algunas cosas que una madre puede hacer para disminuir el riesgo de trabajo de parto prematuro en futuros embarazos. Una mam puede:   Dejar de fumar.   Mantener un peso saludable y evitar sustancias qumicas y drogas innecesarias.   Controlar todo tipo de infeccin.   Informar al mdico si tiene una historia conocida de parto prematuro.  Document Released: 01/12/2008 Document Revised: 09/24/2011 [employer] Patient Information 2012 [employer]. 

## 2012-05-12 NOTE — Progress Notes (Signed)
Urinary frequency, for 17p today. UA reviewed, will order urine culture

## 2012-05-15 LAB — CULTURE, URINE COMPREHENSIVE

## 2012-05-16 ENCOUNTER — Telehealth: Payer: Self-pay | Admitting: *Deleted

## 2012-05-16 MED ORDER — NITROFURANTOIN MONOHYD MACRO 100 MG PO CAPS: 100.0000 mg | ORAL_CAPSULE | Freq: Two times a day (BID) | ORAL | Status: DC

## 2012-05-16 NOTE — Telephone Encounter (Signed)
Message copied by Jill Side on Mon May 16, 2012  2:22 PM ------      Message from: Adam Phenix      Created: Mon May 16, 2012  1:31 PM       UTI, antibiotic sent to pharmacy

## 2012-05-16 NOTE — Addendum Note (Signed)
Addended by: Adam Phenix on: 05/16/2012 01:58 PM   Modules accepted: Orders

## 2012-05-16 NOTE — Telephone Encounter (Signed)
Called pt w/[employer] interpreter # [phone].  A female answered the phone and stated that [name] was not home. She will return in 30-45 min. and  I was asked to call back then.

## 2012-05-16 NOTE — Telephone Encounter (Signed)
Called pt w/[employer] interpreter # [phone].   No answer @ home tel #.

## 2012-05-17 NOTE — Telephone Encounter (Signed)
I called pt w/[employer] interpreter # [phone].  No answer @ home tel #.

## 2012-05-19 ENCOUNTER — Ambulatory Visit (INDEPENDENT_AMBULATORY_CARE_PROVIDER_SITE_OTHER): Payer: Self-pay | Admitting: Obstetrics and Gynecology

## 2012-05-19 VITALS — BP 113/69 | HR 75 | Wt 162.0 lb

## 2012-05-19 DIAGNOSIS — O09219 Supervision of pregnancy with history of pre-term labor, unspecified trimester: Secondary | ICD-10-CM

## 2012-05-19 DIAGNOSIS — O4290 Premature rupture of membranes, unspecified as to length of time between rupture and onset of labor, unspecified weeks of gestation: Secondary | ICD-10-CM

## 2012-05-20 ENCOUNTER — Inpatient Hospital Stay (HOSPITAL_COMMUNITY)
Admission: AD | Admit: 2012-05-20 | Discharge: 2012-05-20 | Disposition: A | Discharge status: Home / Self Care | Payer: Self-pay | Source: Ambulatory Visit | Attending: Obstetrics and Gynecology | Admitting: Obstetrics and Gynecology

## 2012-05-20 DIAGNOSIS — R109 Unspecified abdominal pain: Secondary | ICD-10-CM | POA: Insufficient documentation

## 2012-05-20 DIAGNOSIS — N949 Unspecified condition associated with female genital organs and menstrual cycle: Secondary | ICD-10-CM | POA: Insufficient documentation

## 2012-05-20 DIAGNOSIS — O479 False labor, unspecified: Secondary | ICD-10-CM

## 2012-05-20 DIAGNOSIS — O99891 Other specified diseases and conditions complicating pregnancy: Secondary | ICD-10-CM | POA: Insufficient documentation

## 2012-05-20 NOTE — ED Provider Notes (Signed)
First Provider Initiated Contact with Patient 05/20/12 2158      Chief Complaint:  Pelvic Pain Pt has been having pelvic pressure for the past 3 days.  She is having normal BM's and no UTI symptoms  [name] is  36 y.o. [id number] at [redacted]w[redacted]d presents complaining of Pelvic Pain .  She states a rare contractions are associated with none vaginal bleeding, intact membranes, along with active fetal movement.   Obstetrical/Gynecological History: Menstrual History: OB History    Grav Para Term Preterm Abortions TAB SAB Ect Mult Living   6 5 4 1      4        Patient's last menstrual period was 10/12/2011.     Past Medical History: No past medical history on file.  Past Surgical History: Past Surgical History  Procedure Date  . Cesarean section     Family History: Family History  Problem Relation Age of Onset  . Anesthesia problems Neg Hx   . Hypotension Neg Hx   . Heart disease Mother     Social History: History  Substance Use Topics  . Smoking status: Never Smoker   . Smokeless tobacco: Never Used  . Alcohol Use: No    Allergies: No Known Allergies  Meds:  Prescriptions prior to admission  Medication Sig Dispense Refill  . Prenatal Vit-Fe Fumarate-FA (PRENATAL MULTIVITAMIN) TABS Take 1 tablet by mouth every morning.         Review of Systems - Please refer to the aforementioned patients' reports.     Physical Exam  Blood pressure 102/63, pulse 77, temperature 97.1 F (36.2 C), temperature source Oral, resp. rate 18, last menstrual period 10/12/2011. GENERAL: Well-developed, well-nourished female in no acute distress.  LUNGS: Clear to auscultation bilaterally.  HEART: Regular rate and rhythm. ABDOMEN: Soft, nontender, nondistended, gravid.  EXTREMITIES: Nontender, no edema, 2+ distal pulses. CERVICAL EXAM: Dilatation 0cm   Effacement 50%   Station -3  Posterior, multiparous Presentation: cephalic FHT:  Baseline rate 150 bpm   Variability moderate   Accelerations present   Decelerations none Contractions: Every 0 mins   Labs: Recent Results (from the past 24 hour(s))  FETAL FIBRONECTIN   Collection Time   05/20/12  8:58 PM      Component Value Range   Fetal Fibronectin NEGATIVE  NEGATIVE   Imaging Studies:  No results found.  Assessment: [name] is  36 y.o. [id number] at [redacted]w[redacted]d presents with pelvic pressure/round ligament pain.  Plan: Maternity belt/comfort mesures  CRESENZO-DISHMAN,Augie Vane 8/2/201310:01 PM

## 2012-05-20 NOTE — MAU Note (Signed)
Patient is with c/o 3 days of pelvic, vaginal and rectal pressure. She states that she told the doctor yesterday (gets 17p injection weekly) and was told to drink lots of water. She denies being constipation, vaginal bleeding or lof. She reports good fetal movement and that she drinks plenty of water.

## 2012-05-20 NOTE — Progress Notes (Signed)
Drenda Freeze cnm notified of patient. Order received to obtain FFN

## 2012-05-23 NOTE — ED Provider Notes (Signed)
Agree with above note.  [name] 05/23/2012 10:20 AM

## 2012-05-26 ENCOUNTER — Ambulatory Visit (INDEPENDENT_AMBULATORY_CARE_PROVIDER_SITE_OTHER): Payer: Self-pay | Admitting: Obstetrics and Gynecology

## 2012-05-26 VITALS — BP 106/72 | Temp 98.7°F | Wt 163.9 lb

## 2012-05-26 DIAGNOSIS — O4290 Premature rupture of membranes, unspecified as to length of time between rupture and onset of labor, unspecified weeks of gestation: Secondary | ICD-10-CM

## 2012-05-26 DIAGNOSIS — O0990 Supervision of high risk pregnancy, unspecified, unspecified trimester: Secondary | ICD-10-CM

## 2012-05-26 DIAGNOSIS — Z8611 Personal history of tuberculosis: Secondary | ICD-10-CM

## 2012-05-26 DIAGNOSIS — O09219 Supervision of pregnancy with history of pre-term labor, unspecified trimester: Secondary | ICD-10-CM

## 2012-05-26 DIAGNOSIS — O34219 Maternal care for unspecified type scar from previous cesarean delivery: Secondary | ICD-10-CM

## 2012-05-26 DIAGNOSIS — O09529 Supervision of elderly multigravida, unspecified trimester: Secondary | ICD-10-CM

## 2012-05-26 LAB — POCT URINALYSIS DIP (DEVICE)
Ketones, ur: NEGATIVE mg/dL
Protein, ur: NEGATIVE mg/dL
Specific Gravity, Urine: >=1.03 (ref 1.005–1.030)
Urobilinogen, UA: 0.2 mg/dL (ref 0.0–1.0)

## 2012-05-26 NOTE — Progress Notes (Signed)
Pulse: 75  Has some pelvic pressure.
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
## 2012-06-02 ENCOUNTER — Ambulatory Visit (INDEPENDENT_AMBULATORY_CARE_PROVIDER_SITE_OTHER): Payer: Medicaid Other

## 2012-06-02 VITALS — BP 130/73 | HR 83 | Wt 166.6 lb

## 2012-06-02 DIAGNOSIS — O09219 Supervision of pregnancy with history of pre-term labor, unspecified trimester: Secondary | ICD-10-CM

## 2012-06-09 ENCOUNTER — Ambulatory Visit (INDEPENDENT_AMBULATORY_CARE_PROVIDER_SITE_OTHER): Payer: Medicaid Other | Admitting: Family Medicine

## 2012-06-09 VITALS — BP 117/65 | Temp 97.1°F | Wt 164.9 lb

## 2012-06-09 DIAGNOSIS — O4290 Premature rupture of membranes, unspecified as to length of time between rupture and onset of labor, unspecified weeks of gestation: Secondary | ICD-10-CM

## 2012-06-09 DIAGNOSIS — O09529 Supervision of elderly multigravida, unspecified trimester: Secondary | ICD-10-CM

## 2012-06-09 DIAGNOSIS — O09219 Supervision of pregnancy with history of pre-term labor, unspecified trimester: Secondary | ICD-10-CM

## 2012-06-09 LAB — POCT URINALYSIS DIP (DEVICE)
Bilirubin Urine: NEGATIVE
Nitrite: NEGATIVE
Specific Gravity, Urine: 1.025 (ref 1.005–1.030)
Urobilinogen, UA: 0.2 mg/dL (ref 0.0–1.0)
pH: 6.5 (ref 5.0–8.0)

## 2012-06-09 NOTE — Progress Notes (Signed)
Doing well--PTL precautions  

## 2012-06-09 NOTE — Patient Instructions (Addendum)
Sport and exercise psychologist  (Preterm Labor) El parto prematuro comienza antes de la semana 37 de [place]. La duracin de un embarazo normal es de 39 a 41 semanas.  CAUSAS  Generalmente no hay una causa que pueda identificarse. Sin embargo, una de las causas conocidas ms frecuentes son las infecciones. Las infecciones del tero, el cuello, la vagina, el lquido [place], la vejiga, los riones y Teacher, adult education de los pulmones (neumona) pueden hacer que el trabajo de parto se inicie. Otras causas son:   Infecciones urogenitales, como infecciones por hongos y vaginosis bacteriana.   Anormalidades uterinas (forma del tero, sptum uterino, fibromas, hemorragias en la placenta).   Un cuello que ha sido operado y se abre prematuramente.   Malformaciones del beb.   Gestaciones mltiples (mellizos, trillizos y ms).   Ruptura del saco amnitico.  :Otros factores de riesgo del parto prematuro son   Historia previa de Sport and exercise psychologist.   Ruptura prematura de las [place].   La placenta cubre la apertura del cuello (placenta previa).   La placenta se separa del tero (abrupcin placentaria).   El cuello es demasiado dbil para contener al beb en el tero (cuello incompetente).   Hay mucho lquido en el saco amnitico (polihidramnios).   Consumo de drogas o hbito de fumar durante Firefighter.   No aumentar de peso lo suficiente durante el [employer].   Mujeres menores de 18 aos o [address] de [address] [place].   Nivel socioeconmico bajo.   Raza afroamericana.  SNTOMAS  Los signos y sntomas son:   Clicos del tipo menstrual   Contracciones con un intervalo entre 30 y 70 segundos, comienzan a ser regulares, se hacen ms frecuentes y se hacen ms intensas y dolorosas.   Contracciones que comienzan en la parte superior del tero y se expanden hacia abajo, hacia la zona inferior del abdomen y la espalda.   Sensacin de presin en la pelvis o dolor en la espalda.   Aparece una secrecin acuosa o  sanguinolenta por la vagina.  DIAGNSTICO  El diagnstico puede confirmarse:   Con un examen vaginal.   Ecografa del cuello.   Muestra (hisopado) de las secreciones crvico-vaginales. Estas muestras se analizan para buscar la presencia de fibronectina fetal. Esta protena que se encuentra en las secreciones del tero y se asocia con el parto prematuro.   Monitoreo fetal  TRATAMIENTO  Segn el tiempo del Psychiatrist y otras [place], el mdico puede indicar reposo en cama. Si es necesario, le indicarn medicamentos para TEFL teacher las contracciones y apurar la maduracin de los pulmones del feto. Si el trabajo de parto se inicia antes de las 34 semanas de [place], se recomienda la hospitalizacin. El tratamiento depende de las condiciones en que se encuentre la madre y el beb.  PREVENCIN  Hay algunas cosas que American Financial puede hacer para disminuir el riesgo de trabajo de parto prematuro en futuros [employer]. Una mam puede:   Dejar de fumar.   Mantener un peso saludable y evitar sustancias qumicas y drogas innecesarias.   Controlar todo tipo de infeccin.   Informar al mdico si tiene una historia conocida de parto prematuro.  Document Released: 01/12/2008 Document Revised: 09/24/2011 [facility] Patient Information 2012 [place], [place]. Vanetta Mulders - Systems analyst trimestre (Pregnancy - Third Trimester) El tercer trimestre del embarazo (los ltimos 3 meses) es el perodo de cambios ms rpidos que atraviesan usted y el beb. El aumento de peso es ms rpido. El beb alcanza un largo de aproximadamente 50 cm (20 pulgadas) y pesa  entre 2,700 y 4,500 kg (6 a 10 libras). El beb gana ms tejido graso y ya est listo para la vida fuera del cuerpo de la [place]. Mientras estn en el interior, los bebs tienen perodos de sueo y vigilia, Warehouse manager y tienen hipo. Quizs sienta pequeas contracciones del tero. Este es el falso trabajo de [place]. Tambin se las conoce como contracciones de  [place]. Es como una prctica del parto. Los problemas ms habituales de esta etapa del embarazo incluyen mayor dificultad para respirar, hinchazn de las manos y los pies por retencin de lquidos y la necesidad de Geographical information systems officer con ms frecuencia debido a que el tero y el beb presionan sobre la vejiga.  EXAMENES PRENATALES  Durante los Manpower Inc, deber seguir realizando pruebas de [place], segn avance el [place]. Estas pruebas se realizan para controlar su salud y la del beb. Tambin se realizan anlisis de sangre para [employer] Mutual niveles de [place]. La anemia (bajo nivel de hemoglobina) es frecuente durante el embarazo. Para prevenirla, se administran hierro y vitaminas. Tambin le harn nuevas pruebas para descartar la diabetes. Podrn repetirle algunas de las Hovnan[employer] Enterprises hicieron previamente.   En cada visita le medirn el tamao del tero. Es para asegurarse de que el beb se desarrolla correctamente.   Tambin en cada visita la pesarn. Esto se realiza para asegurarse de que aumenta de peso al ritmo indicado y que usted y su beb evolucionan normalmente.   En algunas ocasiones se realiza una ecografa para confirmar el correcto desarrollo y evolucin del beb. Esta prueba se realiza con ondas sonoras inofensivas para el beb, de modo que el profesional pueda calcular con ms precisin la fecha del [place].   Discuta las posibilidades de la anestesia si necesita cesrea.  Algunas veces se realizan pruebas especializadas del lquido amnitico que rodea al beb. Esta prueba se denomina amniocentesis. El lquido amnitico se obtiene introduciendo una aguja en el abdomen (vientre). En ocasiones se lleva a cabo cerca del final del embarazo, si es Optician, dispensing. En este caso se realiza para asegurarse de que los pulmones del beb estn lo suficientemente maduros como para que pueda vivir fuera del tero. CAMBIOS QUE OCURREN EN EL TERCER TRIMESTRE DEL EMBARAZO Su  organismo atravesar diferentes cambios durante el embarazo que varan de Neomia Dear persona a Educational psychologist. Converse con el profesional que la asiste acerca los cambios que usted note y que la preocupen.  Durante el ltimo trimestre probablemente sienta un aumento del apetito. Es normal tener "antojos" de Development worker, community. Esto vara de Neomia Dear persona a otra y de un embarazo a Therapist, art.   Podrn aparecer las primeras estras en las caderas, abdomen y [place]. Estos son cambios normales del cuerpo durante el [place]. No existen medicamentos ni ejercicios que puedan prevenir [employer].   El estreimiento puede tratarse con un laxante o agregando fibra a su dieta. Beber grandes cantidades de lquidos, tomar fibras en forma de verduras, frutas y granos integrales es de [place].   Tambin es beneficioso practicar actividad fsica. Si ha sido una persona Engineer, mining, podr continuar con la Harley[employer]Davidson de las actividades durante el mismo. Si ha sido [employer], puede ser beneficioso que comience con un programa de ejercicios, Museum/gallery exhibitions officer. Consulte con el profesional que la asiste antes de comenzar un programa de ejercicios.   Evite el consumo de cigarrillos, el alcohol, los medicamentos no prescritos y las "drogas de la calle" durante el [place]. Estas sustancias qumicas  afectan la formacin y el desarrollo del beb. Evite estas sustancias durante todo el embarazo para asegurar el nacimiento de un beb sano.   Dolor de espalda, venas varicosas y hemorroides podran aparecer o empeorar.   Los movimientos del beb pueden ser ms bruscos y aparecer ms a menudo.   Puede que note dificultades para respirar facilmente.   El ombligo podra salrsele hacia afuera.   Puede segregar un lquido amarillento (calostro) de las [place].   Puede segregar mucus con sangre. Esto normalmente ocurre unos [address] a una semana antes de que comience el [place] de [place].  INSTRUCCIONES PARA EL CUIDADO  DOMICILIARIO  La mayor parte de los cuidados que se aconsejan son los mismos que los indicados para las primeras etapas del Psychiatrist. Es importante que concurra a todas las citas con el profesional y siga sus instrucciones con Camera operator a los medicamentos que deba Chemical engineer, a la actividad fsica y a Psychologist, forensic.   Durante el embarazo debe obtener nutrientes para usted y para su beb. Consuma alimentos balanceados a intervalos regulares. Elija alimentos como carne, pescado, [place] y otros productos lcteos descremados, verduras, frutas, panes integrales y cereales. El Equities trader cul es el aumento de peso ideal.   Las relaciones sexuales pueden continuarse hasta casi el final del embarazo, si no se presentan otros problemas como prdida prematura (antes de tiempo) de lquido amnitico, hemorragia vaginal o dolor abdominal (en el vientre).   Realice Tesoro Corporation, si no tiene restricciones. Consulte con el profesional que la asiste si no sabe con certeza si determinados ejercicios son seguros. El mayor aumento de peso se produce Foot Locker ltimos trimestres del [place].   Haga reposo con frecuencia, con las piernas elevadas, o segn lo necesite para evitar los calambres y el dolor de cintura.   Use un buen sostn o como los que se usan para hacer deportes para Paramedic la sensibilidad de las [place]. Tambin puede serle til si lo [place] mientras duerme. Si pierde Product manager, podr Parker Hann[employer]in.   No utilice la baera con agua caliente, baos turcos y saunas.   Colquese el cinturn de seguridad cuando conduzca. Este la proteger a usted y al beb en caso de accidente.   Evite comer carne cruda y el contacto con los utensilios y desperdicios de los gatos. Estos elementos contienen grmenes que pueden causar defectos de nacimiento en el beb.   Es fcil perder algo de orina durante el [place]. Apretar y Chief Operating Officer los msculos de la pelvis la ayudar con este  problema. Practique detener la miccin cuando est en el bao. Estos son los mismos msculos que Development worker, international aid. Son [employer]PP[employer] Partners mismos msculos que utiliza cuando trata de Ryder System gases. Puede practicar apretando estos msculos WellPoint, y repetir esto tres veces por da aproximadamente. Una vez que conozca qu msculos debe contraer, no realice estos ejercicios durante la miccin. Puede favorecerle una infeccin si la orina vuelve hacia atrs.   Pida ayuda si tiene necesidades econmicas, de asesoramiento o nutricionales durante el [place]. El profesional podr ayudarla con respecto a estas necesidades, o derivarla a otros especialistas.   Practique la ida [employer] [facility] a modo de [place].   Tome clases prenatales junto con su pareja para comprender, practicar y hacer preguntas acerca del Aleen Campi de parto y el nacimiento.   Prepare la habitacin del beb.   No viaje fuera de la ciudad a menos que sea absolutamente necesario y con el  consejo del mdico.   Use slo zapatos bajos sin taco para tener un mejor equilibrio y prevenir cadas.  EL CONSUMO DE MEDICAMENTOS Y DROGAS DURANTE EL EMBARAZO  Contine tomando las vitaminas apropiadas para esta etapa tal como se le indic. Las vitaminas deben contener un miligramo de cido flico y deben suplementarse con hierro. Guarde todas las vitaminas fuera del alcance de los nios. La ingestin de slo un par de vitaminas o comprimidos que contengan hierro pueden ocasionar la Newmont Mining en un beb o en un nio pequeo.   Evite el uso de [place], inclusive los de venta [place], que no hayan sido prescritos o indicados por el profesional que la asiste. Algunos medicamentos pueden causar problemas fsicos al beb. Utilice los medicamentos de venta libre o de prescripcin para Chief Technology Officer, Environmental health practitioner o la [place], segn se lo indique el profesional que lo asiste. No utilice aspirina, ibuprofeno (Motrin, Advil, Nuprin) o naproxeno (Aleve) a menos que  el profesional la autorice.   El alcohol se asocia a cierto nmero de defectos del nacimiento, incluido el sndrome de alcoholismo fetal. Debe evitar el consumo de alcohol en cualquiera de sus formas. El cigarrillo causa nacimientos prematuros y bebs de bajo peso al nacer. Las drogas de la calle son muy nocivas para el beb y estn absolutamente prohibidas. Un beb que nace de American Express, ser adicto al nacer. Ese beb tendr los mismos sntomas de abstinencia que un adulto.   Infrmele al profesional si consume alguna droga.  SOLICITE ATENCIN MDICA SI: Tiene alguna preocupacin Academic librarian. Es mejor que llame para formular las preguntas si no puede esperar hasta la prxima visita, que sentirse preocupada por ellas.  DECISIONES ACERCA DE LA CIRCUNCISIN Usted puede saber o no cul es el sexo de su beb. Si es un varn, ste es el momento de pensar acerca de la circuncisin. La circuncisin es la extirpacin del prepucio. Esta es la piel que cubre el extremo sensible del pene. No hay un motivo mdico que lo justifique. Generalmente la decisin se toma segn lo que sea popular en ese momento, o se basa en creencias religiosas. Podr conversar estos temas con el profesional que la asiste. SOLICITE ATENCIN MDICA DE INMEDIATO SI:  La temperatura oral se eleva sin motivo por encima de 102 F (38.9 C) o segn le indique el profesional que la asiste.   Tiene una prdida de lquido por la vagina (canal de parto). Si sospecha una ruptura de las [place], tmese la temperatura y llame al profesional para informarlo sobre esto.   Observa unas pequeas manchas, una hemorragia vaginal o elimina cogulos. Avsele al profesional acerca de la cantidad y de cuntos apsitos est utilizando.   Presenta un olor desagradable en la secrecin vaginal y observa un cambio en el color, de transparente a blanco.   Ha vomitado durante ms de 24 horas.   Presenta escalofros o fiebre.   Comienza a  sentir falta de aire.   Siente ardor al Beatrix Shipper.   [place] o sube ms de 900 g (ms de 2 libras), o segn lo indicado por el profesional que la asiste. Observa que sbitamente se le hinchan el rostro, las manos, los pies o las piernas.   Presenta dolor abdominal. Las molestias en el ligamento redondo son Neomia Dear causa benigna (no cancerosa) frecuente de Engineer, mining abdominal durante el Psychiatrist, pero el profesional que la asiste deber evaluarlo.   Presenta dolor de cabeza intenso que no se [place].   Si no siente  los movimientos del beb durante ms de tres horas. Si piensa que el beb no se mueve tanto como lo haca habitualmente, coma algo que Psychologist, clinical y Target Corporation lado izquierdo durante [place]. El beb debe moverse al menos 4  5 veces por hora. Comunquese inmediatamente si el beb se mueve menos que lo indicado.   Se cae, se ve involucrada en un accidente automovilstico o sufre algn tipo de traumatismo.   En su hogar hay violencia mental o fsica.  Document Released: 07/15/2005 Document Revised: 09/24/2011 [facility] Patient Information 2012 [place], [place]. Eleccin del mtodo anticonceptivo  (Contraception Choices) La anticoncepcin (control de la natalidad) es el uso de cualquier mtodo o dispositivo para Location manager. A continuacin se indican algunos de esos mtodos.  MTODOS HORMONALES   Implante anticonceptivo. Es un tubo plstico delgado que contiene la hormona progesterona. No contiene estrgenos. El mdico inserta el tubo en la parte interna del brazo. El tubo puede Geneticist, molecular durante 3 aos. Despus de los 3 aos debe retirarse. El implante impide que los ovarios liberen vulos (ovulacin), espesa el moco cervical, lo que evita que los espermatozoides ingresen al tero y hace ms delgada la membrana que cubre el interior del tero.   Inyecciones de progesterona sola. Estas inyecciones se administran cada 3 meses para evitar el embarazo. La progesterona  sinttica impide que los ovarios liberen vulos. Tambin hace que el moco cervical se espese y modifica el recubrimiento interno del tero. Esto hace ms difcil que los espermatozoides sobrevivan en el tero.   Pldoras anticonceptivas. Las pldoras anticonceptivas contienen estrgenos y Education officer, museum. Actan impidiendo que el vulo se forme en el ovario(ovulacin). Las pldoras anticonceptivas son recetadas por el mdico.Tambin se utilizan para tratar los perodos menstruales abundantes.   Minipldora. Este tipo de pldora anticonceptiva contiene slo hormona progesterona. Deben tomarse todos los [address] del mes y debe recetarlas el mdico.   Parches anticonceptivos. El parche contiene hormonas similares a las que contienen las pldoras anticonceptivas. Deben cambiarse una vez por semana y se utilizan bajo prescripcin mdica.   Anillo vaginal. Anillo vaginal contiene hormonas similares a las que contienen las pldoras anticonceptivas. Se deja colocado durante tres semanas, se lo retira durante 1 semana y luego se coloca uno nuevo. La paciente debe sentirse cmoda para insertar y retirar el anillo de la vagina.Es necesaria la receta del mdico.   Anticonceptivos de Associate Professor. Los anticonceptivos de emergencia son mtodos para evitar un embarazo despus de una relacin sexual sin proteccin. Esta pldora puede tomarse inmediatamente despus de Child psychotherapist sexuales o hasta 5 [place] de haber tenido sexo sin proteccin. Es ms efectiva si se toma poco tiempo despus. Los anticonceptivos de emergencia estn disponibles sin prescripcin mdica. Consltelo con su farmacutico. No use los anticonceptivos de emergencia como nico mtodo anticonceptivo.  MTODOS DE BARRERA   Condn masculino. Es una vaina delgada (ltex o goma) que se [place] en el pene durante el acto sexual. Deri Fuelling con espermicida para aumentar la efectividad.   Condn femenino. Es una vaina blanda y floja que se adapta suavemente a la  vagina antes de las relaciones sexuales.   Diafragma. Es una barrera de ltex redonda y Casimer Bilis que debe ser ajustada por un profesional. Se inserta en la vagina, junto con un gel espermicida. Debe insertarse antes de Management consultant. Debe dejar el diafragma colocado en la vagina durante 6 a 8 horas despus de la relacin sexual.   Capuchn cervical. Es una taza de  ltex o plstico, redonda y [place] que cubre el cuello del tero y debe ser ajustada por un mdico. Puede dejarlo colocado en la vagina hasta 48 horas despus de las relaciones sexuales.   Esponja. Es una pieza blanda y circular de espuma de poliuretano. Contiene un espermicida. Se inserta en la vagina despus de mojarla y antes de las The St. Paul [employer].   Espermicidas. Los espermicidas son qumicos que matan o bloquean el esperma y no lo dejan ingresar al cuello del tero y al tero. Vienen en forma de cremas, geles, supositorios, espuma o comprimidos. No es necesario tener Emergency planning/management officer. Se insertan en la vagina con un aplicador antes de Management consultant. El proceso debe repetirse cada vez que tiene relaciones sexuales.  ANTICONCEPTIVOS INTRAUTERINOS   Dispositivo intrauterino (DIU). Es un dispositivo en forma de T que se coloca en el tero durante el perodo menstrual, para Location manager. Hay dos tipos:   DIU de cobre. Este tipo de DIU est recubierto con un alambre de cobre y se inserta dentro del tero. El cobre hace que el tero y las trompas de Falopio produzcan un liquido que [employer] Stores espermatozoides. Puede permanecer colocado durante 10 aos.   DIU hormonal. Este tipo de DIU contiene la hormona progestina (progesterona sinttica). La hormona espesa el moco cervical y evita que los espermatozoides ingresen al tero y tambin afina la membrana que cubre el tero para evitar la implantacin del vulo fertilizado. La hormona debilita o destruye los espermatozoides que ingresan al tero. Puede permanecer  colocado durante 5 aos.  MTODOS ANTICONCEPTIVOS PERMANENTES   Ligadura de trompas en la mujer. La ligadura de trompas en la mujer se realiza sellando, atando u obstruyendo quirrgicamente las trompas de Falopio lo que impide que el vulo descienda hacia el tero.   Esterilizacin masculina. Se realiza atando los conductos por los que pasan los espermatozoides (vasectoma).Esto impide que el esperma ingrese a la vagina durante el acto sexual. Luego del procedimiento, el hombre puede eyacular lquido (semen).  MTODOS DE PLANIFICACIN NATURAL   Planificacin familiar natural.  Consiste en no tener relaciones sexuales o usar un mtodo de barrera (condn, [place], capuchn cervical) en los [employer] la mujer podra quedar [place].   Mtodo calendario.  Consiste en el seguimiento de la duracin de cada ciclo menstrual y la identificacin de los perodos frtiles.   Mtodo de Occupational hygienist.  Consiste en evitar las relaciones sexuales durante la ovulacin.   Mtodo sintotrmico. Paramedic las relaciones sexuales en la poca en la que se est ovulando, utilizando un termmetro y tendiendo en cuenta los sntomas de la ovulacin.   Mtodo post-ovulacin. Consiste en planificar las relaciones sexuales para despus de haber ovulado.  Independientemente del tipo o mtodo anticonceptivo que usted elija, es importante que use condones para protegerse contra las enfermedades de transmisin sexual (ETS). Hable con su mdico con respecto a qu mtodo anticonceptivo es el ms apropiado para usted.  Document Released: 10/05/2005 Document Revised: 09/24/2011 [facility] Patient Information 2012 [place], [place]. Amamantar al beb (Breastfeeding) LOS BENEFICIOS DE AMAMANTAR Para el beb  La primera leche (calostro ) ayuda al mejor funcionamiento del sistema digestivo del beb.   La leche tiene anticuerpos que provienen de la madre y que ayudan a prevenir las infecciones en el beb.   Hay una menor  incidencia de asma, enfermedades alrgicas y SMSI (sndrome de muerte sbita nfantil).   Los nutrientes que contiene la [place] materna son mejores que las frmulas para el bibern  y favorecen el desarrollo cerebral.   Los bebs amamantados sufren menos gases, clicos y constipacin.  Para la mam  La lactancia materna favorece el desarrollo de un vnculo muy especial entre la madre y el beb.   Es ms conveniente, siempre disponible a la Optician, dispensing y ms econmica que la [employer].   Consume caloras en la madre y la ayuda a perder el peso ganado durante el [place].   Favorece la contraccin del tero a su tamao normal, de manera ms rpida y [employer] las hemorragias luego del [place].   Las [employer] Holdings que amamantan tienen menor riesgo de Geophysical data processor de mama.  AMAMNTELO CON FRECUENCIA  Un beb sano, nacido a trmino, puede amamantarse con tanta frecuencia como cada hora, o espaciar las comidas cada tres horas.   Esta frecuencia variar de un beb a otro. Observe al beb cuando manifieste signos de hambre, antes que regirse por el reloj.   Amamntelo tan seguido como el beb lo solicite, o cuando usted sienta la necesidad de Paramedic sus [place].   Despierte al beb si han pasado 3  4 horas desde la ltima comida.   El amamantamiento frecuente la ayudar a producir ms [place] y a Education officer, community de Engineer, mining en los pezones e hinchazn de las [place].  LA POSICIN DEL BEB PARA AMAMANTARLO  Ya sea que se encuentre acostada o sentada, asegrese que el abdomen del beb enfrente el suyo.   Sostenga la mama con el pulgar por arriba y el resto de los dedos por debajo. Asegrese que sus dedos se encuentren lejos del pezn y de la boca del beb.   Toque suavemente los labios del beb y la mejilla ms cercana a la mama con el dedo o el pezn.   Cuando la boca del beb se abra lo suficiente, introduzca el pezn y la zona oscura que lo rodea tanto como le sea posible dentro de  la boca.   Coloque a beb cerca suyo de modo que su nariz y mejillas toquen las mamas al [employer].  LAS COMIDAS  La duracin de cada comida vara de un beb a otro y de [place] comida a [place].   El beb debe succionar alrededor American Financial o tres minutos para que le llegue [place]. Esto se denomina "bajada". Por este motivo, permita que el nio se alimente en cada mama todo lo que desee. Terminar de mamar cuando haya recibido la cantidad [place] de nutrientes.   Para detener la succin coloque su dedo en la comisura de la boca del nio y Midwife entre sus encas antes de quitarle la mama de la boca. Esto la ayudar a English as a second language teacher.  REDUCIR LA CONGESTIN DE LAS MAMAS  Durante la primera semana despus del parto, usted puede experimentar [employer]. Cuando las mamas estn congestionadas, se sienten calientes, llenas y molestas al tacto. Puede reducir la congestin si:   Lo amamanta frecuentemente, cada 2-3 horas. Las mams que [employer] pronto y con frecuencia tienen menos problemas de [place].   Coloque bolsas fras livianas entre cada [place]. Esto ayuda a Building services engineer. Envuelva las bolsas de hielo en una toalla liviana para proteger su piel.   Aplique compresas hmedas calientes [employer] durante 5 a 10 minutos antes de amamantar al [employer]. Esto aumenta la circulacin y [place] a que la [place].   Masajee suavemente la mama antes y Psychologist, sport and exercise.   Asegrese que el nio vaca al [employer]  mama antes de cambiar de lado.   Use un sacaleche para vaciar la mama si el beb se duerme o no se alimenta bien. Tambin podr [employer] con esta bomba si tiene que volver al trabajo o siente que las mamas estn congestionadas.   Evite los biberones, chupetes o complementar la alimentacin con agua o jugos en lugar de la [place].   Verifique que el beb se encuentra en la posicin correcta mientras lo alimenta.   Evite el cansancio, el estrs  y la anemia   Use un soutien que sostenga bien sus mamas y evite los que tienen aro.   Consuma una dieta balanceada y beba lquidos en cantidad.  Si sigue estas indicaciones, la congestin debe mejorar en 24 a 48 horas. Si an tiene dificultades, consulte a Barista. TENDR SUFICIENTE LECHE MI BEB? Algunas veces las madres se preocupan acerca de si sus bebs tendrn la leche suficiente. Puede asegurarse que el beb tiene la leche suficiente si:  El beb succiona y escucha que traga activamente.   El nio se alimenta al menos 8 a 12 veces en 24 horas. Alimntelo hasta que se desprenda por sus propios medios o se quede dormido en la primera mama (al menos durante 10 a 20 minutos), luego ofrzcale el otro lado.   El beb moja 5 a 6 paales descartables (6 a 8 paales de tela) en 24 horas cuando tiene 5  6 das de vida.   Tiene al menos 2-3 deposiciones todos los [employer] primeros meses. La leche materna es todo el alimento que el beb necesita. No es necesario que el nio ingiera agua o preparados de bibern. De hecho, para ayudar a que sus mamas produzcan ms [place], lo mejor es no darle al beb suplementos durante las primeras semanas.   La materia fecal debe ser blanda y [place].   El beb debe aumentar 112 a 196 g por semana.  CUDESE Cuide sus mamas del siguiente modo:  Bese o dchese diariamente.   No lave sus pezones con jabn.   Comience a amamantar del lado izquierdo en una comida y del lado derecho en la siguiente.   Notar que [employer] suministro de [place] a los 2 a 5 [address]  [address] despus del [place]. Puede sentir algunas molestias por la congestin, lo que hace que sus mamas estn duras y sensibles. La congestin disminuye en 24 a 48 horas. Mientras tanto, aplique toallas hmedas calientes durante 5 a 10 minutos antes de amamantar. Un masaje suave y la extraccin de un poco de leche antes de Museum/gallery exhibitions officer ablandarn las mamas y har ms fcil que el beb se agarre.  Use un buen sostn y seque al aire los pezones durante 10 a 15 minutos luego de cada alimentacin.   Solo utilice apsitos de algodn.   Utilice lanolina [employer] pezones luego de [place]. No necesita lavarlos luego de alimentar al [employer].  Cudese del siguiente modo:   Consuma alimentos bien balanceados y refrigerios nutritivos.   Dixie Dials, jugos de fruta y agua para Warehouse manager sed (alrededor de 8 vasos por Futures trader).   Descanse lo suficiente.   Aumente la ingesta de calcio en la dieta (1200mg /da).   Evite los alimentos que usted nota que puedan afectar al beb.  SOLICITE ATENCIN MDICA SI:  Tiene preguntas que formular o dificultades con la alimentacin a pecho.   Necesita ayuda.   Observa una zona dura, roja y que le duele en la zona  de la mama, y se acompaa de fiebre de 100.5 F (38.1 C) o ms.   El beb est muy somnoliento como para alimentarse bien o tiene problemas para dormir.   El beb moja menos de 6 paales por da, a partir de los [address] de [place].   La piel del beb o la parte blanca de sus ojos est ms amarilla de lo que estaba en el hospital.   Se siente deprimida.  Document Released: 10/05/2005 Document Revised: 09/24/2011 [facility] Patient Information 2012 [place], [place].

## 2012-06-09 NOTE — Progress Notes (Signed)
Pulse-  72    Edema- feet  Pressure- lower abd

## 2012-06-16 ENCOUNTER — Ambulatory Visit (INDEPENDENT_AMBULATORY_CARE_PROVIDER_SITE_OTHER): Payer: Medicaid Other | Admitting: General Practice

## 2012-06-16 VITALS — BP 99/69 | HR 79 | Temp 97.2°F | Ht <= 58 in | Wt 167.7 lb

## 2012-06-16 DIAGNOSIS — O09219 Supervision of pregnancy with history of pre-term labor, unspecified trimester: Secondary | ICD-10-CM

## 2012-06-16 LAB — POCT URINALYSIS DIP (DEVICE)
Hgb urine dipstick: NEGATIVE
Protein, ur: 30 mg/dL — AB
pH: 6 (ref 5.0–8.0)

## 2012-06-23 ENCOUNTER — Encounter: Payer: Self-pay | Admitting: Obstetrics and Gynecology

## 2012-06-23 ENCOUNTER — Encounter: Payer: Medicaid Other | Admitting: Obstetrics and Gynecology

## 2012-06-23 ENCOUNTER — Ambulatory Visit (INDEPENDENT_AMBULATORY_CARE_PROVIDER_SITE_OTHER): Payer: Self-pay | Admitting: Obstetrics and Gynecology

## 2012-06-23 VITALS — BP 125/77 | Temp 97.0°F | Wt 169.1 lb

## 2012-06-23 DIAGNOSIS — O09529 Supervision of elderly multigravida, unspecified trimester: Secondary | ICD-10-CM

## 2012-06-23 LAB — POCT URINALYSIS DIP (DEVICE)
Ketones, ur: NEGATIVE mg/dL
Protein, ur: 30 mg/dL — AB
Specific Gravity, Urine: 1.025 (ref 1.005–1.030)
Urobilinogen, UA: 0.2 mg/dL (ref 0.0–1.0)
pH: 6 (ref 5.0–8.0)

## 2012-06-23 NOTE — Patient Instructions (Signed)
Contracciones de Braxton Hicks (Braxton Hicks Contractions) Usted presenta un falso trabajo de parto. Durante todo el embarazo aparecen con frecuencia contracciones del tero. Hacia el final del embarazo (32-34 semanas) estas contracciones (Braxton Hicks) pueden hacerse ms fuertes. No se trata de un trabajo de parto verdadero porque no producen un agrandamiento (dilatacin) y afinamiento del cuello del tero. Algunas veces resulta difcil distinguirlas del trabajo de parto verdadero porque en algunos casos llegan a ser muy intensas y las personas tienen distinta tolerancia al dolor. No debe sentirse avergonzada si ingresa al hospital con un falso trabajo de parto. En ocasiones la nica forma de saber si est en un parto verdadero es observar los cambios en el cuello del tero. A veces, la nica forma de saber si realmente est en trabajo de parto es para el mdico observar los cambios en el tero. Como diferenciar el trabajo de parto falso del verdadero:  Trabajos de parto falso.   Las contracciones falsas generalmente duran menos y no son tan intensas como las verdaderas.   Generalmente se sienten en la zona inferior del abdomen y en la ingle.   Pueden aliviarse con una caminata o cambiar de posicin mientras se est acostada.   A medida que pasa el tiempo son ms cortas y dbiles.   Generalmente son irregulares.   No se hacen progresivamente ms intensas y cercanas entre s como las verdaderas.   Trabajo de parto verdadero.   Las contracciones verdaderas duran de 30 a 70 segundos, son ms regulares, generalmente se hacen ms intensas y aumentan en frecuencia.   No desaparecen al caminar.   La molestia generalmente se siente en la parte superior del tero y se extiende hacia la zona inferior del abdomen y hacia la cintura.   El profesional que la asiste podr examinarla para determinar si el trabajo de parto es verdadero. El examen mostrar si el cuello del tero se est dilatando y  afinando.  Si no hay problemas prenatales u otras complicaciones de la salud asociadas al embarazo, no habr inconvenientes si la envan a su casa y espera el comienzo del verdadero trabajo de parto. INSTRUCCIONES PARA EL CUIDADO DOMICILIARIO  Siga con los ejercicios y las indicaciones habituales.   Tome los medicamentos como se le indic.   Cumpla con las citas regularmente.   Coma y beba ligero si cree que dar a luz.   Si se siente incmoda por las contracciones:   Cambie de actividad, si est acostada o en reposo, camine y si est caminando, repose.   Sintense y repose en una baadera con agua caliente.   Beba entre 2 y 3 vasos de agua. La deshidratacin puede causar contracciones BH.   Respire lenta y profundamente varias veces por hora.  SOLICITE ATENCIN MDICA DE INMEDIATO SI:  Las contracciones se intensifican, se hacen ms regulares y cercanas entre s.   Tiene una prdida importante de lquido de la vagina   La temperatura oral se eleva sin motivo por encima de 102 F (38.9 C) o segn le indique el profesional que la asiste.   Elimina una mucosidad sanguinolenta.   Presenta hemorragia vaginal.   Presenta dolor abdominal constante.   Siente un dolor en la parte baja de la espalda que nunca haba sentido antes.   Siente que el beb empuja hacia abajo y le causa presin plvica.   El beb no se mueve tanto como antes.  Document Released: 07/15/2005 Document Revised: 09/24/2011 ExitCare Patient Information 2012 Exit[employer]. 

## 2012-06-23 NOTE — Progress Notes (Signed)
Having some irreg UCs, pelvic pressure. Cx posterior thick. Sx labor reviewed

## 2012-06-23 NOTE — Progress Notes (Signed)
Pulse- 81  Edema- "@ night time feet swell"  Pain-lower back and abd Pt felt faint Tuesday x 1 when had lower back pain

## 2012-06-30 ENCOUNTER — Inpatient Hospital Stay (HOSPITAL_COMMUNITY)
Admission: AD | Admit: 2012-06-30 | Discharge: 2012-07-01 | Disposition: A | Discharge status: Home / Self Care | Payer: Self-pay | Source: Ambulatory Visit | Attending: Obstetrics & Gynecology | Admitting: Obstetrics & Gynecology

## 2012-06-30 ENCOUNTER — Encounter: Payer: Self-pay | Admitting: Obstetrics & Gynecology

## 2012-06-30 ENCOUNTER — Encounter (HOSPITAL_COMMUNITY): Payer: Self-pay | Admitting: Obstetrics and Gynecology

## 2012-06-30 DIAGNOSIS — O09529 Supervision of elderly multigravida, unspecified trimester: Secondary | ICD-10-CM

## 2012-06-30 DIAGNOSIS — O479 False labor, unspecified: Secondary | ICD-10-CM | POA: Insufficient documentation

## 2012-06-30 DIAGNOSIS — O169 Unspecified maternal hypertension, unspecified trimester: Secondary | ICD-10-CM

## 2012-06-30 DIAGNOSIS — O09219 Supervision of pregnancy with history of pre-term labor, unspecified trimester: Secondary | ICD-10-CM

## 2012-06-30 DIAGNOSIS — O26899 Other specified pregnancy related conditions, unspecified trimester: Secondary | ICD-10-CM

## 2012-06-30 DIAGNOSIS — O34219 Maternal care for unspecified type scar from previous cesarean delivery: Secondary | ICD-10-CM

## 2012-06-30 DIAGNOSIS — O0990 Supervision of high risk pregnancy, unspecified, unspecified trimester: Secondary | ICD-10-CM

## 2012-06-30 DIAGNOSIS — R109 Unspecified abdominal pain: Secondary | ICD-10-CM | POA: Insufficient documentation

## 2012-06-30 LAB — COMPREHENSIVE METABOLIC PANEL
ALT: 11 U/L (ref 0–35)
AST: 16 U/L (ref 0–37)
Alkaline Phosphatase: 158 U/L — ABNORMAL HIGH (ref 39–117)
CO2: 25 mEq/L (ref 19–32)
Chloride: 102 mEq/L (ref 96–112)
GFR calc Af Amer: >90 mL/min (ref >90)
GFR calc non Af Amer: >90 mL/min (ref >90)
Glucose, Bld: 100 mg/dL — ABNORMAL HIGH (ref 70–99)
Potassium: 3.4 mEq/L — ABNORMAL LOW (ref 3.5–5.1)
Sodium: 137 mEq/L (ref 135–145)
Total Bilirubin: 0.1 mg/dL — ABNORMAL LOW (ref 0.3–1.2)

## 2012-06-30 LAB — CBC
Hemoglobin: 10.9 g/dL — ABNORMAL LOW (ref 12.0–15.0)
MCH: 27 pg (ref 26.0–34.0)
MCHC: 32.8 g/dL (ref 30.0–36.0)
Platelets: 178 10*3/uL (ref 150–400)
RDW: 14.8 % (ref 11.5–15.5)

## 2012-06-30 LAB — WET PREP, GENITAL: Yeast Wet Prep HPF POC: NONE SEEN

## 2012-06-30 MED ORDER — ACETAMINOPHEN 500 MG PO TABS
1000.0000 mg | ORAL_TABLET | Freq: Once | ORAL | Status: AC
  Administered 2012-06-30: 1000 mg via ORAL
  Filled 2012-06-30: qty 2

## 2012-06-30 NOTE — MAU Note (Signed)
Pt presents to MAU with complaints to constant right lower abdominal pain and a white discharge that started 2 days ago. Pt is [redacted]w[redacted]d; G6P4, patient says baby has good movement.

## 2012-07-01 DIAGNOSIS — O139 Gestational [pregnancy-induced] hypertension without significant proteinuria, unspecified trimester: Secondary | ICD-10-CM

## 2012-07-01 NOTE — ED Provider Notes (Signed)
I saw and examined patient and agree with above. Pt has been having contractions for 4 days 15 to 20 minutes apart. They are somewhat more frequent today. She also complains of headache and blurry vision with elevated BP initially in mild range. Proteinuria 30 mg/dl - stable over last several visits.  Cervix unchanged from office visit - stretchy 2 to 3 cm, 50%, -2. Headache and blurry vision resolved with tylenol. Labor precautions discussed and signs/symptoms preeclampsia. Pt sent home with 24 hour urine collection. Has f/u in 1 week in [facility].

## 2012-07-01 NOTE — ED Provider Notes (Signed)
History     CSN: [id number]  Arrival date and time: 06/30/12 2118   First Provider Initiated Contact with Patient 06/30/12 2306      Chief Complaint  Patient presents with  . Abdominal Pain   A language interpreter was used.   HPI: Pt is a [id number] at [redacted]w[redacted]d being seen by High risk clinic due to PPROM @ 20 weeks this pregnancy (fluid nl 3 weeks later by [place]) as well as a history of preterm birth. She is complaining of cramping pain 4/10 in her stomach, back and pelvis that is consistent with contractions )occuring every 10-15 mins).  She has also has some light white discharge the past few days.  Further inquiry realed she has also been having  Mild 3.5/10 headaches,blurry vision for the past several days. No vaginal bleeding, CP, SOB, lightheadedness, fevers, chills, cough, N/V, headache.  OB History    Grav Para Term Preterm Abortions TAB SAB Ect Mult Living   6 5 4 1      4       No past medical history on file.  Past Surgical History  Procedure Date  . Cesarean section     Family History  Problem Relation Age of Onset  . Anesthesia problems Neg Hx   . Hypotension Neg Hx   . Heart disease Mother     History  Substance Use Topics  . Smoking status: Never Smoker   . Smokeless tobacco: Never Used  . Alcohol Use: No    Allergies: No Known Allergies  Prescriptions prior to admission  Medication Sig Dispense Refill  . Prenatal Vit-Fe Fumarate-FA (PRENATAL MULTIVITAMIN) TABS Take 1 tablet by mouth every morning.         ROS  Physical Exam   Blood pressure 118/75, pulse 68, temperature 98 F (36.7 C), temperature source Oral, resp. rate 18, height 4\' 10"  (1.473 m), weight 171 lb 8 oz (77.792 kg), last menstrual period 10/12/2011.  Physical Exam  Constitutional: She appears well-developed and well-nourished. No distress.  HENT:  Head: Normocephalic and atraumatic.  Eyes: Conjunctivae normal and EOM are normal. Pupils are equal, round, and reactive to light. Right  eye exhibits no discharge. Left eye exhibits no discharge. No scleral icterus.  Neck: Normal range of motion. Neck supple.  Cardiovascular: Normal rate, regular rhythm, S1 normal, S2 normal, normal heart sounds and intact distal pulses.  Exam reveals no gallop, no S3, no S4 and no friction rub.   No murmur heard. Respiratory: Effort normal. No respiratory distress. She has no wheezes. She has no rales. She exhibits no tenderness.  GI: Normal appearance and bowel sounds are normal. She exhibits no pulsatile liver and no abdominal bruit. There is no hepatosplenomegaly. There is no tenderness. There is no rebound and no guarding.  Genitourinary: Vagina normal.       Dilation: 2 Effacement (%): 50 Exam by:: Dr Thad Ranger   Neurological: No cranial nerve deficit or sensory deficit.  Skin: Skin is warm and dry. No rash noted. She is not diaphoretic. No erythema. No pallor.    MAU Course  Procedures   Patient was initially hypertensive 142/76 on admission to MAU. Re-check before discharge showed 118/75. Pt given acetaminophen 1g which relieved her headache.  Eval for Pre-Eclampsia: LFTs, CBC, CMP,  UA Wet Prep  Assessment and Plan   Results for orders placed during the hospital encounter of 06/30/12 (from the past 24 hour(s))  WET PREP, GENITAL     Status: Abnormal   Collection  Time   06/30/12 11:20 PM      Component Value Range   Yeast Wet Prep HPF POC NONE SEEN  NONE SEEN   Trich, Wet Prep NONE SEEN  NONE SEEN   Clue Cells Wet Prep HPF POC NONE SEEN  NONE SEEN   WBC, Wet Prep HPF POC TOO NUMEROUS TO COUNT (*) NONE SEEN  COMPREHENSIVE METABOLIC PANEL     Status: Abnormal   Collection Time   06/30/12 11:25 PM      Component Value Range   Sodium 137  135 - 145 mEq/L   Potassium 3.4 (*) 3.5 - 5.1 mEq/L   Chloride 102  96 - 112 mEq/L   CO2 25  19 - 32 mEq/L   Glucose, Bld 100 (*) 70 - 99 mg/dL   BUN 10  6 - 23 mg/dL   Creatinine, Ser 8.29  0.50 - 1.10 mg/dL   Calcium 9.2  8.4 - 56.2 mg/dL    Total Protein 6.3  6.0 - 8.3 g/dL   Albumin 2.6 (*) 3.5 - 5.2 g/dL   AST 16  0 - 37 U/L   ALT 11  0 - 35 U/L   Alkaline Phosphatase 158 (*) 39 - 117 U/L   Total Bilirubin 0.1 (*) 0.3 - 1.2 mg/dL   GFR calc non Af Amer >90  >90 mL/min   GFR calc Af Amer >90  >90 mL/min  CBC     Status: Abnormal   Collection Time   06/30/12 11:25 PM      Component Value Range   WBC 7.2  4.0 - 10.5 K/uL   RBC 4.04  3.87 - 5.11 MIL/uL   Hemoglobin 10.9 (*) 12.0 - 15.0 g/dL   HCT 13.0 (*) 86.5 - 78.4 %   MCV 82.2  78.0 - 100.0 fL   MCH 27.0  26.0 - 34.0 pg   MCHC 32.8  30.0 - 36.0 g/dL   RDW 69.6  29.5 - 28.4 %   Platelets 178  150 - 400 K/uL    Pt not in labor.  Labs reassuring for no pre-eclampsia.  Have patient return with 24 hour urine.   Doran Heater 07/01/2012, 12:49 AM

## 2012-07-07 ENCOUNTER — Inpatient Hospital Stay (HOSPITAL_COMMUNITY)
Admission: AD | Admit: 2012-07-07 | Discharge: 2012-07-09 | DRG: 775 | Disposition: A | Discharge status: Home / Self Care | Payer: Medicaid Other | Source: Ambulatory Visit | Attending: Obstetrics and Gynecology | Admitting: Obstetrics and Gynecology

## 2012-07-07 ENCOUNTER — Ambulatory Visit (INDEPENDENT_AMBULATORY_CARE_PROVIDER_SITE_OTHER): Payer: Self-pay | Admitting: Family Medicine

## 2012-07-07 ENCOUNTER — Encounter: Payer: Self-pay | Admitting: Family Medicine

## 2012-07-07 VITALS — BP 131/88 | Temp 96.6°F | Wt 170.0 lb

## 2012-07-07 DIAGNOSIS — O139 Gestational [pregnancy-induced] hypertension without significant proteinuria, unspecified trimester: Secondary | ICD-10-CM

## 2012-07-07 DIAGNOSIS — O09529 Supervision of elderly multigravida, unspecified trimester: Secondary | ICD-10-CM

## 2012-07-07 DIAGNOSIS — O34219 Maternal care for unspecified type scar from previous cesarean delivery: Principal | ICD-10-CM | POA: Diagnosis present

## 2012-07-07 DIAGNOSIS — Z22330 Carrier of Group B streptococcus: Secondary | ICD-10-CM

## 2012-07-07 DIAGNOSIS — O0990 Supervision of high risk pregnancy, unspecified, unspecified trimester: Secondary | ICD-10-CM

## 2012-07-07 DIAGNOSIS — O09219 Supervision of pregnancy with history of pre-term labor, unspecified trimester: Secondary | ICD-10-CM

## 2012-07-07 DIAGNOSIS — O99892 Other specified diseases and conditions complicating childbirth: Secondary | ICD-10-CM | POA: Diagnosis present

## 2012-07-07 LAB — COMPREHENSIVE METABOLIC PANEL
AST: 15 U/L (ref 0–37)
Alkaline Phosphatase: 165 U/L — ABNORMAL HIGH (ref 39–117)
BUN: 10 mg/dL (ref 6–23)
Glucose, Bld: 70 mg/dL (ref 70–99)
Sodium: 137 mEq/L (ref 135–145)
Total Bilirubin: 0.2 mg/dL — ABNORMAL LOW (ref 0.3–1.2)

## 2012-07-07 LAB — POCT URINALYSIS DIP (DEVICE)
Bilirubin Urine: NEGATIVE
Glucose, UA: NEGATIVE mg/dL
Hgb urine dipstick: NEGATIVE
Nitrite: NEGATIVE
Urobilinogen, UA: 0.2 mg/dL (ref 0.0–1.0)

## 2012-07-07 LAB — CBC
Hemoglobin: 11.9 g/dL — ABNORMAL LOW (ref 12.0–15.0)
MCH: 26.9 pg (ref 26.0–34.0)
MCHC: 33.5 g/dL (ref 30.0–36.0)
RDW: 15.5 % (ref 11.5–15.5)

## 2012-07-07 NOTE — Progress Notes (Signed)
P = 73 Mild edema in BLE Pressure in the lower pelvic area Contractions ~ q 20 minutes

## 2012-07-07 NOTE — Progress Notes (Signed)
Cultures  Today Mildly increased BP and protein-check labs. Membranes stripped.

## 2012-07-08 ENCOUNTER — Encounter (HOSPITAL_COMMUNITY): Payer: Self-pay | Admitting: *Deleted

## 2012-07-08 ENCOUNTER — Inpatient Hospital Stay (HOSPITAL_COMMUNITY): Payer: Medicaid Other | Admitting: Anesthesiology

## 2012-07-08 ENCOUNTER — Encounter (HOSPITAL_COMMUNITY): Payer: Self-pay | Admitting: Anesthesiology

## 2012-07-08 DIAGNOSIS — O479 False labor, unspecified: Secondary | ICD-10-CM

## 2012-07-08 DIAGNOSIS — O34219 Maternal care for unspecified type scar from previous cesarean delivery: Secondary | ICD-10-CM

## 2012-07-08 DIAGNOSIS — O09529 Supervision of elderly multigravida, unspecified trimester: Secondary | ICD-10-CM

## 2012-07-08 DIAGNOSIS — O9989 Other specified diseases and conditions complicating pregnancy, childbirth and the puerperium: Secondary | ICD-10-CM

## 2012-07-08 LAB — PROTEIN / CREATININE RATIO, URINE
Protein Creatinine Ratio: 0.09 (ref <0.15)
Total Protein, Urine: 18 mg/dL

## 2012-07-08 LAB — GC/CHLAMYDIA PROBE AMP, GENITAL: Chlamydia, DNA Probe: NEGATIVE

## 2012-07-08 LAB — CBC
Hemoglobin: 11 g/dL — ABNORMAL LOW (ref 12.0–15.0)
MCH: 27 pg (ref 26.0–34.0)
MCHC: 32.9 g/dL (ref 30.0–36.0)
RDW: 14.7 % (ref 11.5–15.5)

## 2012-07-08 LAB — RPR: RPR Ser Ql: NONREACTIVE

## 2012-07-08 MED ORDER — IBUPROFEN 600 MG PO TABS
600.0000 mg | ORAL_TABLET | Freq: Four times a day (QID) | ORAL | Status: DC
  Administered 2012-07-08 – 2012-07-09 (×4): 600 mg via ORAL
  Filled 2012-07-08 (×4): qty 1

## 2012-07-08 MED ORDER — OXYCODONE-ACETAMINOPHEN 5-325 MG PO TABS
1.0000 | ORAL_TABLET | ORAL | Status: DC | PRN
  Administered 2012-07-08 (×2): 1 via ORAL
  Filled 2012-07-08 (×2): qty 1

## 2012-07-08 MED ORDER — ZOLPIDEM TARTRATE 5 MG PO TABS: 5.0000 mg | ORAL_TABLET | Freq: Every evening | ORAL | Status: DC | PRN

## 2012-07-08 MED ORDER — DIPHENHYDRAMINE HCL 25 MG PO CAPS: 25.0000 mg | ORAL_CAPSULE | Freq: Four times a day (QID) | ORAL | Status: DC | PRN

## 2012-07-08 MED ORDER — ONDANSETRON HCL 4 MG PO TABS: 4.0000 mg | ORAL_TABLET | ORAL | Status: DC | PRN

## 2012-07-08 MED ORDER — LACTATED RINGERS IV SOLN: 500.0000 mL | INTRAVENOUS | Status: DC | PRN

## 2012-07-08 MED ORDER — OXYTOCIN 40 UNITS IN LACTATED RINGERS INFUSION - SIMPLE MED: 1.0000 m[IU]/min | INTRAVENOUS | Status: DC

## 2012-07-08 MED ORDER — TETANUS-DIPHTH-ACELL PERTUSSIS 5-2.5-18.5 LF-MCG/0.5 IM SUSP: 0.5000 mL | Freq: Once | INTRAMUSCULAR | Status: DC

## 2012-07-08 MED ORDER — ONDANSETRON HCL 4 MG/2ML IJ SOLN: 4.0000 mg | INTRAMUSCULAR | Status: DC | PRN

## 2012-07-08 MED ORDER — ONDANSETRON HCL 4 MG/2ML IJ SOLN: 4.0000 mg | Freq: Four times a day (QID) | INTRAMUSCULAR | Status: DC | PRN

## 2012-07-08 MED ORDER — LIDOCAINE HCL (PF) 1 % IJ SOLN
30.0000 mL | INTRAMUSCULAR | Status: DC | PRN
  Filled 2012-07-08: qty 30

## 2012-07-08 MED ORDER — IBUPROFEN 600 MG PO TABS: 600.0000 mg | ORAL_TABLET | Freq: Four times a day (QID) | ORAL | Status: DC | PRN

## 2012-07-08 MED ORDER — SENNOSIDES-DOCUSATE SODIUM 8.6-50 MG PO TABS
2.0000 | ORAL_TABLET | Freq: Every day | ORAL | Status: DC
  Administered 2012-07-08: 2 via ORAL

## 2012-07-08 MED ORDER — PHENYLEPHRINE 40 MCG/ML (10ML) SYRINGE FOR IV PUSH (FOR BLOOD PRESSURE SUPPORT)
80.0000 ug | PREFILLED_SYRINGE | INTRAVENOUS | Status: DC | PRN
  Filled 2012-07-08: qty 5

## 2012-07-08 MED ORDER — FENTANYL CITRATE 0.05 MG/ML IJ SOLN: 50.0000 ug | Freq: Once | INTRAMUSCULAR | Status: DC

## 2012-07-08 MED ORDER — PRENATAL MULTIVITAMIN CH
1.0000 | ORAL_TABLET | Freq: Every day | ORAL | Status: DC
  Administered 2012-07-08 – 2012-07-09 (×2): 1 via ORAL
  Filled 2012-07-08 (×2): qty 1

## 2012-07-08 MED ORDER — FENTANYL 2.5 MCG/ML BUPIVACAINE 1/10 % EPIDURAL INFUSION (WH - ANES)
14.0000 mL/h | INTRAMUSCULAR | Status: DC
  Administered 2012-07-08 (×3): 14 mL/h via EPIDURAL
  Filled 2012-07-08 (×3): qty 60

## 2012-07-08 MED ORDER — OXYTOCIN 40 UNITS IN LACTATED RINGERS INFUSION - SIMPLE MED
62.5000 mL/h | Freq: Once | INTRAVENOUS | Status: AC
  Administered 2012-07-08: 62.5 mL/h via INTRAVENOUS
  Filled 2012-07-08 (×2): qty 1000

## 2012-07-08 MED ORDER — SODIUM CHLORIDE 0.9 % IV SOLN
2.0000 g | Freq: Once | INTRAVENOUS | Status: AC
  Administered 2012-07-08: 2 g via INTRAVENOUS
  Filled 2012-07-08: qty 2000

## 2012-07-08 MED ORDER — EPHEDRINE 5 MG/ML INJ
10.0000 mg | INTRAVENOUS | Status: DC | PRN
  Filled 2012-07-08: qty 4

## 2012-07-08 MED ORDER — SIMETHICONE 80 MG PO CHEW: 80.0000 mg | CHEWABLE_TABLET | ORAL | Status: DC | PRN

## 2012-07-08 MED ORDER — LACTATED RINGERS IV SOLN: 500.0000 mL | Freq: Once | INTRAVENOUS | Status: DC

## 2012-07-08 MED ORDER — EPHEDRINE 5 MG/ML INJ: 10.0000 mg | INTRAVENOUS | Status: DC | PRN

## 2012-07-08 MED ORDER — OXYTOCIN BOLUS FROM INFUSION
500.0000 mL | Freq: Once | INTRAVENOUS | Status: DC
  Filled 2012-07-08: qty 500

## 2012-07-08 MED ORDER — TERBUTALINE SULFATE 1 MG/ML IJ SOLN: 0.2500 mg | Freq: Once | INTRAMUSCULAR | Status: DC | PRN

## 2012-07-08 MED ORDER — FLEET ENEMA 7-19 GM/118ML RE ENEM: 1.0000 | ENEMA | RECTAL | Status: DC | PRN

## 2012-07-08 MED ORDER — WITCH HAZEL-GLYCERIN EX PADS: 1.0000 "application " | MEDICATED_PAD | CUTANEOUS | Status: DC | PRN

## 2012-07-08 MED ORDER — DIBUCAINE 1 % RE OINT: 1.0000 "application " | TOPICAL_OINTMENT | RECTAL | Status: DC | PRN

## 2012-07-08 MED ORDER — PHENYLEPHRINE 40 MCG/ML (10ML) SYRINGE FOR IV PUSH (FOR BLOOD PRESSURE SUPPORT): 80.0000 ug | PREFILLED_SYRINGE | INTRAVENOUS | Status: DC | PRN

## 2012-07-08 MED ORDER — BENZOCAINE-MENTHOL 20-0.5 % EX AERO: 1.0000 "application " | INHALATION_SPRAY | CUTANEOUS | Status: DC | PRN

## 2012-07-08 MED ORDER — OXYTOCIN 40 UNITS IN LACTATED RINGERS INFUSION - SIMPLE MED
1.0000 m[IU]/min | INTRAVENOUS | Status: DC
  Administered 2012-07-08: 5 m[IU]/min via INTRAVENOUS
  Administered 2012-07-08: 1 m[IU]/min via INTRAVENOUS

## 2012-07-08 MED ORDER — OXYCODONE-ACETAMINOPHEN 5-325 MG PO TABS: 1.0000 | ORAL_TABLET | ORAL | Status: DC | PRN

## 2012-07-08 MED ORDER — CITRIC ACID-SODIUM CITRATE 334-500 MG/5ML PO SOLN: 30.0000 mL | ORAL | Status: DC | PRN

## 2012-07-08 MED ORDER — LACTATED RINGERS IV SOLN
INTRAVENOUS | Status: DC
  Administered 2012-07-08: 01:00:00 via INTRAVENOUS

## 2012-07-08 MED ORDER — ACETAMINOPHEN 325 MG PO TABS
650.0000 mg | ORAL_TABLET | ORAL | Status: DC | PRN
  Administered 2012-07-08: 650 mg via ORAL
  Filled 2012-07-08: qty 2

## 2012-07-08 MED ORDER — LIDOCAINE HCL (PF) 1 % IJ SOLN
INTRAMUSCULAR | Status: DC | PRN
  Administered 2012-07-08 (×2): 5 mL

## 2012-07-08 MED ORDER — LANOLIN HYDROUS EX OINT: TOPICAL_OINTMENT | CUTANEOUS | Status: DC | PRN

## 2012-07-08 MED ORDER — DIPHENHYDRAMINE HCL 50 MG/ML IJ SOLN: 12.5000 mg | INTRAMUSCULAR | Status: DC | PRN

## 2012-07-08 NOTE — Anesthesia Procedure Notes (Signed)
Epidural Patient location during procedure: OB Start time: 07/08/2012 2:32 AM  Staffing Anesthesiologist: Brayton Caves R Performed by: anesthesiologist   Preanesthetic Checklist Completed: patient identified, site marked, surgical consent, pre-op evaluation, timeout performed, IV checked, risks and benefits discussed and monitors and equipment checked  Epidural Patient position: sitting Prep: site prepped and draped and DuraPrep Patient monitoring: continuous pulse ox and blood pressure Approach: midline Injection technique: LOR air and LOR saline  Needle:  Needle type: Tuohy  Needle gauge: 17 G Needle length: 9 cm and 9 Needle insertion depth: 5 cm cm Catheter type: closed end flexible Catheter size: 19 Gauge Catheter at skin depth: 10 cm Test dose: negative  Assessment Events: blood not aspirated, injection not painful, no injection resistance, negative IV test and no paresthesia  Additional Notes Patient identified.  Risk benefits discussed including failed block, incomplete pain control, headache, nerve damage, paralysis, blood pressure changes, nausea, vomiting, reactions to medication both toxic or allergic, and postpartum back pain.  Patient expressed understanding and wished to proceed.  All questions were answered.  Sterile technique used throughout procedure and epidural site dressed with sterile barrier dressing. No paresthesia or other complications noted.The patient did not experience any signs of intravascular injection such as tinnitus or metallic taste in mouth nor signs of intrathecal spread such as rapid motor block. Please see nursing notes for vital signs.

## 2012-07-08 NOTE — Anesthesia Preprocedure Evaluation (Signed)
Anesthesia Evaluation  Patient identified by MRN, date of birth, ID band Patient awake    Reviewed: Allergy & Precautions, H&P , Patient's Chart, lab work & pertinent test results  Airway Mallampati: III TM Distance: >3 FB Neck ROM: full    Dental No notable dental hx.    Pulmonary neg pulmonary ROS,  breath sounds clear to auscultation  Pulmonary exam normal       Cardiovascular negative cardio ROS  Rhythm:regular Rate:Normal     Neuro/Psych negative neurological ROS  negative psych ROS   GI/Hepatic negative GI ROS, Neg liver ROS,   Endo/Other  negative endocrine ROS  Renal/GU negative Renal ROS     Musculoskeletal   Abdominal   Peds  Hematology negative hematology ROS (+)   Anesthesia Other Findings tolac  Reproductive/Obstetrics (+) Pregnancy                           Anesthesia Physical Anesthesia Plan  ASA: II  Anesthesia Plan: Epidural   Post-op Pain Management:    Induction:   Airway Management Planned:   Additional Equipment:   Intra-op Plan:   Post-operative Plan:   Informed Consent: I have reviewed the patients History and Physical, chart, labs and discussed the procedure including the risks, benefits and alternatives for the proposed anesthesia with the patient or authorized representative who has indicated his/her understanding and acceptance.     Plan Discussed with:   Anesthesia Plan Comments:         Anesthesia Quick Evaluation

## 2012-07-08 NOTE — Progress Notes (Signed)
[name] is a 36 y.o. [id number] at [redacted]w[redacted]d admitted for active labor  Subjective: Pt with some pain/pressure at her vagina, feels ready to push.  Objective: BP 144/72  Pulse 77  Temp 98.2 F (36.8 C) (Oral)  Resp 18  Ht 4\' 10"  (1.473 m)  Wt 77.111 kg (170 lb)  BMI 35.53 kg/m2  SpO2 100%  LMP 10/12/2011 I/O last 3 completed shifts: In: -  Out: 750 [Urine:750]    FHT:  FHR: 135 bpm, variability: moderate,  accelerations:  Present,  decelerations:  Present variables UC:   regular, every 4-5 minutes SVE:   Dilation: 10 Effacement (%): 90 Station: +1 Exam by:: MW  Labs: Lab Results  Component Value Date   WBC 12.6* 07/08/2012   HGB 11.0* 07/08/2012   HCT 33.4* 07/08/2012   MCV 82.1 07/08/2012   PLT 185 07/08/2012    Assessment / Plan: Spontaneous labor, progressing normally, augmenting with pitocin since this morning when patient's   Labor: Progressing normally Preeclampsia:  n/a Fetal Wellbeing:  Category II Pain Control:  Epidural I/D:  n/a Anticipated MOD:  NSVD  Simone Curia 07/08/2012, 10:35 AM

## 2012-07-08 NOTE — H&P (Signed)
[name] is a 36 y.o. female presenting for active labor. Contractions x 2 days, worsening last night/this morning.  Some bloody show this evening/early morning. No LOF. +FM Maternal Medical History:  Reason for admission: Reason for admission: contractions.  Contractions: Onset was 2 days ago.   Frequency: regular.   Perceived severity is strong.    Fetal activity: Perceived fetal activity is normal.    Prenatal complications: Bleeding.   No hypertension or pre-eclampsia.   Prenatal Complications - Diabetes: none.    OB History    Grav Para Term Preterm Abortions TAB SAB Ect Mult Living   6 5 4 1      4      History reviewed. No pertinent past medical history. Past Surgical History  Procedure Date  . Cesarean section    Family History: family history includes Heart disease in her mother.  There is no history of Anesthesia problems and Hypotension. Social History:  reports that she has never smoked. She has never used smokeless tobacco. She reports that she does not drink alcohol or use illicit drugs.   Prenatal Transfer Tool  Maternal Diabetes: No Genetic Screening: Normal Maternal Ultrasounds/Referrals: Normal Fetal Ultrasounds or other Referrals:  None Maternal Substance Abuse:  No Significant Maternal Medications:  None Significant Maternal Lab Results:  Lab values include: Group B Strep positive Other Comments:  None  ROS  Dilation: 6 Effacement (%): 80 Station: -1 Exam by:: Callaway, RN  Blood pressure 146/74, pulse 82, temperature 97.3 F (36.3 C), temperature source Oral, resp. rate 18, height 4\' 10"  (1.473 m), weight 77.111 kg (170 lb), last menstrual period 10/12/2011, SpO2 100.00%. Maternal Exam:  Uterine Assessment: Contraction strength is moderate.  Abdomen: Fetal presentation: vertex  Pelvis: adequate for delivery.   Cervix: Cervix evaluated by digital exam.     Fetal Exam Fetal Monitor Review: Baseline rate: 145.  Variability: moderate  (6-25 bpm).   Pattern: accelerations present and no decelerations.    Fetal State Assessment: Category I - tracings are normal.     Physical Exam  Constitutional: She appears well-developed and well-nourished.  Cardiovascular: Normal rate, regular rhythm and intact distal pulses.   No murmur heard. Respiratory: Effort normal and breath sounds normal.  Musculoskeletal: She exhibits no edema.  Neurological: She is alert.  Skin: Skin is warm and dry.    Prenatal labs: ABO, Rh: O/POS/-- (05/30 0930) Antibody: NEG (05/30 0930) Rubella: 304.6 (05/30 0930) RPR: NON REAC (06/27 1202)  HBsAg: NEGATIVE (05/30 0930)  HIV: NON REACTIVE (06/27 1202)  GBS:   POSITIVE  Assessment/Plan: 36yo [id number] @ 38.4 presents in active labor -Admit to L&D -Amp for GBS positive, pt grandmultip and 6cm dilated -Desires VBAC (h/o C/S with 2nd pregnancy, has had 3 successful VBACs)   [name] 07/08/2012, 12:59 AM

## 2012-07-08 NOTE — MAU Note (Signed)
Pt states she has been having some spotting this evening and more contractions.

## 2012-07-08 NOTE — Anesthesia Postprocedure Evaluation (Signed)
Anesthesia Post Note  Patient: [name]  Procedure(s) Performed: * No procedures listed *  Anesthesia type: Epidural  Patient location: Mother/Baby  Post pain: Pain level controlled  Post assessment: Post-op Vital signs reviewed  Last Vitals:  Filed Vitals:   07/08/12 1730  BP: 119/66  Pulse: 93  Temp: 37 C  Resp: 18    Post vital signs: Reviewed  Level of consciousness:alert  Complications: No apparent anesthesia complications

## 2012-07-08 NOTE — MAU Note (Signed)
CALLED   FOR BED ASSIGNMENT - CHARGE SAYS UNABLE TO TAKE PT AT THIS TIME.

## 2012-07-08 NOTE — Progress Notes (Signed)
Patient ID: [name], female   DOB: 05/20/1976, 36 y.o.   MRN: [id number]  Comfortable with epidural  FHR stable and reassuring UCs every 2-4 min  Cervix:  Dilation: 6 Effacement (%): 80 Cervical Position: Middle Station: -2 Presentation: Vertex Exam by:: Artelia Laroche CNM  Anticipate SVD

## 2012-07-08 NOTE — H&P (Signed)
Attestation of Attending Supervision of Advanced Practitioner (CNM/NP): Evaluation and management procedures were performed by the Advanced Practitioner under my supervision and collaboration.  I have reviewed the Advanced Practitioner's note and chart, and I agree with the management and plan.  Cherine Drumgoole 07/08/2012 6:15 AM   

## 2012-07-09 MED ORDER — INFLUENZA VIRUS VACC SPLIT PF IM SUSP: 0.5000 mL | INTRAMUSCULAR | Status: DC

## 2012-07-09 MED ORDER — IBUPROFEN 600 MG PO TABS: 600.0000 mg | ORAL_TABLET | Freq: Four times a day (QID) | ORAL | Status: DC

## 2012-07-09 NOTE — Discharge Summary (Signed)
Obstetric Discharge Summary Reason for Admission: onset of labor Prenatal Procedures: none Intrapartum Procedures: spontaneous vaginal delivery- VBAC Postpartum Procedures: none Complications-Operative and Postpartum: none Hemoglobin  Date Value Range Status  07/08/2012 11.0* 12.0 - 15.0 g/dL Final     HCT  Date Value Range Status  07/08/2012 33.4* 36.0 - 46.0 % Final    Physical Exam:  General: alert, cooperative and no distress Lochia: appropriate Uterine Fundus: firm DVT Evaluation: No evidence of DVT seen on physical exam.  Discharge Diagnoses: Term Pregnancy-delivered  Discharge Information: Date: 07/09/2012 Activity: pelvic rest Diet: routine Medications: PNV and Ibuprofen Condition: stable Instructions: refer to practice specific booklet Discharge to: home Follow-up Information    Follow up with [facility]. (Make a postpartum appointment for 4-6 weeks.)    Contact information:   [address] [place] [address] [phone]         Newborn Data: Live born female  Birth Weight: 6 lb 6.5 oz (2905 g) APGAR: 9, 9  Home with mother. Undecided re contraception.  Cam Hai 07/09/2012, 7:26 AM

## 2012-07-09 NOTE — Clinical Social Work Note (Signed)
CSW spoke briefly with MOB with interpreter in room.  MOB reports no hx of PPD.  CSW and interpreter made sure to ask in different ways, however MOB still reports not PPD and no hx of depression in the past.  Please reconsult if further needs arise.  

## 2012-07-10 NOTE — Discharge Summary (Signed)
Attestation of Attending Supervision of Advanced Practitioner: Evaluation and management procedures were performed by the PA/NP/CNM/OB Fellow under my supervision/collaboration. Chart reviewed and agree with management and plan.  Tilda Burrow 07/10/2012 6:17 PM

## 2012-07-11 NOTE — Progress Notes (Signed)
Post discharge chart review completed.  

## 2012-07-12 LAB — TYPE AND SCREEN
ABO/RH(D): O POS
Unit division: 0

## 2012-07-14 ENCOUNTER — Encounter: Payer: Self-pay | Admitting: Family Medicine

## 2012-07-14 ENCOUNTER — Encounter: Payer: Self-pay | Admitting: Obstetrics and Gynecology

## 2012-08-11 ENCOUNTER — Ambulatory Visit: Payer: Self-pay | Admitting: Family Medicine

## 2012-08-11 ENCOUNTER — Encounter: Payer: Self-pay | Admitting: *Deleted

## 2012-09-19 ENCOUNTER — Emergency Department (HOSPITAL_COMMUNITY): Payer: Self-pay

## 2012-09-19 ENCOUNTER — Observation Stay (HOSPITAL_COMMUNITY)
Admission: EM | Admit: 2012-09-19 | Discharge: 2012-09-20 | Discharge status: Against Medical Advice | Payer: Self-pay | Attending: Internal Medicine | Admitting: Internal Medicine

## 2012-09-19 DIAGNOSIS — Z8611 Personal history of tuberculosis: Principal | ICD-10-CM | POA: Diagnosis present

## 2012-09-19 DIAGNOSIS — R079 Chest pain, unspecified: Secondary | ICD-10-CM

## 2012-09-19 DIAGNOSIS — R0989 Other specified symptoms and signs involving the circulatory and respiratory systems: Secondary | ICD-10-CM | POA: Insufficient documentation

## 2012-09-19 DIAGNOSIS — M79609 Pain in unspecified limb: Secondary | ICD-10-CM | POA: Insufficient documentation

## 2012-09-19 DIAGNOSIS — M542 Cervicalgia: Secondary | ICD-10-CM | POA: Insufficient documentation

## 2012-09-19 DIAGNOSIS — O0940 Supervision of pregnancy with grand multiparity, unspecified trimester: Secondary | ICD-10-CM | POA: Diagnosis present

## 2012-09-19 DIAGNOSIS — R05 Cough: Secondary | ICD-10-CM | POA: Insufficient documentation

## 2012-09-19 DIAGNOSIS — O09529 Supervision of elderly multigravida, unspecified trimester: Secondary | ICD-10-CM | POA: Diagnosis present

## 2012-09-19 DIAGNOSIS — R0789 Other chest pain: Secondary | ICD-10-CM | POA: Insufficient documentation

## 2012-09-19 DIAGNOSIS — R911 Solitary pulmonary nodule: Secondary | ICD-10-CM | POA: Insufficient documentation

## 2012-09-19 DIAGNOSIS — O34219 Maternal care for unspecified type scar from previous cesarean delivery: Secondary | ICD-10-CM | POA: Diagnosis present

## 2012-09-19 DIAGNOSIS — R222 Localized swelling, mass and lump, trunk: Secondary | ICD-10-CM | POA: Insufficient documentation

## 2012-09-19 DIAGNOSIS — R197 Diarrhea, unspecified: Secondary | ICD-10-CM | POA: Insufficient documentation

## 2012-09-19 DIAGNOSIS — J984 Other disorders of lung: Secondary | ICD-10-CM | POA: Insufficient documentation

## 2012-09-19 DIAGNOSIS — R059 Cough, unspecified: Secondary | ICD-10-CM | POA: Insufficient documentation

## 2012-09-19 DIAGNOSIS — R0609 Other forms of dyspnea: Secondary | ICD-10-CM | POA: Insufficient documentation

## 2012-09-19 DIAGNOSIS — O09219 Supervision of pregnancy with history of pre-term labor, unspecified trimester: Secondary | ICD-10-CM

## 2012-09-19 HISTORY — DX: Tuberculosis of lung: A15.0

## 2012-09-19 LAB — BASIC METABOLIC PANEL
CO2: 26 mEq/L (ref 19–32)
Chloride: 99 mEq/L (ref 96–112)
Glucose, Bld: 83 mg/dL (ref 70–99)
Potassium: 3.8 mEq/L (ref 3.5–5.1)
Sodium: 136 mEq/L (ref 135–145)

## 2012-09-19 LAB — CBC
Hemoglobin: 10.8 g/dL — ABNORMAL LOW (ref 12.0–15.0)
Platelets: 284 10*3/uL (ref 150–400)
RBC: 4.21 MIL/uL (ref 3.87–5.11)
WBC: 7.2 10*3/uL (ref 4.0–10.5)

## 2012-09-19 LAB — TROPONIN I: Troponin I: <0.3 ng/mL (ref <0.30)

## 2012-09-19 LAB — HEPATIC FUNCTION PANEL
ALT: 28 U/L (ref 0–35)
Albumin: 3.6 g/dL (ref 3.5–5.2)
Alkaline Phosphatase: 84 U/L (ref 39–117)
Total Protein: 7.6 g/dL (ref 6.0–8.3)

## 2012-09-19 MED ORDER — MORPHINE SULFATE 4 MG/ML IJ SOLN
4.0000 mg | Freq: Once | INTRAMUSCULAR | Status: AC
  Administered 2012-09-19: 4 mg via INTRAVENOUS
  Filled 2012-09-19: qty 1

## 2012-09-19 MED ORDER — ONDANSETRON HCL 4 MG/2ML IJ SOLN
4.0000 mg | Freq: Once | INTRAMUSCULAR | Status: AC
  Administered 2012-09-19: 4 mg via INTRAVENOUS
  Filled 2012-09-19: qty 2

## 2012-09-19 MED ORDER — IOHEXOL 350 MG/ML SOLN
100.0000 mL | Freq: Once | INTRAVENOUS | Status: AC | PRN
  Administered 2012-09-19: 100 mL via INTRAVENOUS

## 2012-09-19 NOTE — ED Notes (Signed)
Pt with left sided chest pain for one week which is sharp at times and radiates down left arm.  Pt also reports SOB at times.  Pt self medicated with an antibiotic for a cough several weeks ago.

## 2012-09-19 NOTE — ED Notes (Signed)
Pt c/o L anterior chest pain since for one week. Pt states pain is constant and she has also had pain in her L arm for one week.

## 2012-09-19 NOTE — H&P (Signed)
[facility] History and Physical  [name] ZOX:[id number] DOB: [birth year] DOA: 09/19/2012  Referring physician: Sharen Hones, FNP ED PCP: No primary provider on file.  Specialists: Infectious disease-Dr.Van Dam  Chief Complaint: Chest pain.  HPI: [name] is a 36 y.o. female came to [facility] ed 09/19/2012 with some pain for the past 5-6 days with CP which is intermittent and for the past 3-4 days has been having very strong deep pain that travelled in her L arm, stabbing pain in her arm-she has had this pain before in the past she states -she has had this almost every 2-3 months and has had it chronically for about 1 year ago.   It was previously a stabbing deep pain inside.  It generally is in her chest like a really uncomfortable feeling but now. Over the past month the pain seems to have increased to the point.  It is about a 3/10 intensity in pain at baseline, and seems to wax and wane and can become a 7/10 in intensity. It is made worse by nothing.  She has 2-3 advil per day for this byut this did not seem to help her. 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  She was treated with Abx for 1 year and was taking 7-8 tablets per day  She states  That she was diagnosed with Tb in 2004, after having a cough and fever and malaise for a logn period of time .   She wasn't made aware of this until checked at the clinic there.  She recently delivered a baby-the surgar was high in the last part of her pregnancy  Review of Systems: +cough for about 1 week ago and lasted 4-5 days-she took some antibiotics for this-she took some OTC pills at a [place] store (seems to be penicillin), +sputum.  Fever nil, Has Cp right now in upper chest.  No sob, + diarrhea x 3-4 Episodes yesterday, no abd pain currently  No dysuria, no weaknessto one side of body Some  pain in head and goes down arm    No past medical history on file. Past Surgical History  Procedure Date  . Cesarean section    Social History:  History   Social History Narrative  . No narrative on file    No Known Allergies  Family History  Problem Relation Age of Onset  . Anesthesia problems Neg Hx   . Hypotension Neg Hx   . Heart disease Mother     Prior to Admission medications   Medication Sig Start Date End Date Taking? Authorizing Provider  ibuprofen (ADVIL,MOTRIN) 600 MG tablet Take 1 tablet (600 mg total) by mouth every 6 (six) hours. 07/09/12  Yes Arabella Merles, CNM   Physical Exam: Filed Vitals:   09/19/12 1817 09/19/12 2230  BP: 139/82 137/53  Pulse: 69 63  Temp: 98 F (36.7 C)   TempSrc: Oral   Resp: 17 16  SpO2: 100% 98%   Alert oriented Hiospanic female in NAD Chest clinically clear no tactile vocal resonance no tactile vocal fremitus and not able to appreciate any submandibular or supraclavicular lymphadenopathy S1-S2 no murmur rub or gallop Abdomen is soft slightly obese there is surgical scar consistent with prior cesarean section present there is no tenderness Upper neck and shoulder seems slightly tender and full on the left side No CVA tenderness No lower extremity edema Motor  grossly intact   Labs on Admission:  Basic Metabolic Panel:  Lab 09/19/12 9147  NA 136  K 3.8  CL 99  CO2 26  GLUCOSE 83  BUN 15  CREATININE 0.70  CALCIUM 9.1  MG --  PHOS --   Liver Function Tests:  Lab 09/19/12 2024  AST 27  ALT 28  ALKPHOS 84  BILITOT 0.1*  PROT 7.6  ALBUMIN 3.6   No results found for this basename: LIPASE:5,AMYLASE:5 in the last 168 hours No results found for this basename: AMMONIA:5 in the last 168 hours CBC:  Lab 09/19/12 1857  WBC 7.2  NEUTROABS --  HGB 10.8*  HCT 32.9*  MCV 78.1  PLT 284   Cardiac Enzymes:  Lab 09/19/12 1857  CKTOTAL --  CKMB --  CKMBINDEX --  TROPONINI <0.30    BNP (last 3  results) No results found for this basename: PROBNP:3 in the last 8760 hours CBG: No results found for this basename: GLUCAP:5 in the last 168 hours  Radiological Exams on Admission: Dg Chest 2 View  09/19/2012  *RADIOLOGY REPORT*  Clinical Data: Chest pain  CHEST - 2 VIEW  Comparison: 02/01/2005  Findings: Heart size is normal.  There is no pleural effusion or edema.  Left upper lobe scarring and multiple nodular densities throughout the left lung are again noted and appear progressive from previous exam.  Right lung is relatively clear.  Review of the visualized osseous structures is unremarkable.  IMPRESSION:  1.  Interstitial nodularity and left apical scarring consistent with the history of TB.  No superimposed airspace consolidation identified.   Original Report Authenticated By: Signa Kell, M.D.    Ct Angio Chest Pe W/cm &/or Wo Cm  09/19/2012  *RADIOLOGY REPORT*  Clinical Data: Left-sided chest pain, radiating down left arm; shortness of breath.  CT ANGIOGRAPHY CHEST  Technique:  Multidetector CT imaging of the chest using the standard protocol during bolus administration of intravenous contrast. Multiplanar reconstructed images including MIPs were obtained and reviewed to evaluate the vascular anatomy.  Contrast: OMNIPAQUE IOHEXOL 350 MG/ML SOLN  Comparison: Chest radiograph performed 02/01/2005  Findings: There is no evidence of significant pulmonary embolus.  There is a prominent multilobulated 4.7 x 4.1 cm mass at the left lung apex, with associated atelectasis.  Additional nodular opacity is noted throughout the left upper and lower lung lobes.  Scattered calcifications within the left apical mass reflect chronic changes, but this is highly suspicious for acute infection.  Additional minimal nodular opacity is noted at the inferior aspect of the right upper lobe.  Given the patient's history of tuberculosis, this is most compatible with active tuberculosis, though a fungal infection  could have a similar appearance.  There is no evidence of bronchiectasis to suggest aspergillosis.  There is no evidence of pleural effusion or pneumothorax.  Scattered hilar nodes are borderline normal in size; no definite mediastinal lymphadenopathy is seen.  No pericardial effusion is identified.  The great vessels are grossly unremarkable in appearance.  The thyroid gland is unremarkable.  No axillary lymphadenopathy is appreciated.  The visualized portions of the liver and spleen are unremarkable.  No acute osseous abnormalities are seen.  IMPRESSION:  1.  No evidence of significant pulmonary embolus. 2.  Prominent multilobulated 4.7 x 4.1 cm mass at the left lung apex, with associated atelectasis.  Additional nodular opacity throughout the left upper and lower lung lobes, with minimal nodular opacity at the inferior aspect of the right upper lobe. This  is highly suspicious for acute infection; on comparison with prior chest radiographs, there appears to be acute disease superimposed on the patient's prior findings.  Given the patient's history of tuberculosis, this is most compatible with active tuberculosis, though a fungal infection could have a similar appearance.  No evidence of bronchiectasis to suggest aspergillosis.  These results were called by telephone on 09/19/2012 at 11:25 p.m. to Earley Favor PA, who verbally acknowledged these results.   Original Report Authenticated By: Tonia Ghent, M.D.     EKG: Independently reviewed. none performed or indicated  Assessment/Plan Active Problems:  * No active hospital problems. *    1. 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 Daiva Eves. Patient potentially will need for drug therapy for a minimum of 6-9 months. We do not have a diagnosis yet and although this looks very much like tuberculosis based on the chest x-ray, aspergillosis and other fungal superinfection cannot be ruled out because  she has had cavitary TB in 2004. Patient will be seen by infectious disease tomorrow. She'll get sputum expectorated and may need a bronchoscopy to definitively determine this diagnosis. She also states that her-56-year-old son has had a cough and a fever for the past 2 weeks which leads me to believe that this is probably more of a secondarily infected tuberculosis. She'll be admitted and kept on airborne isolation and precautions with expectorated sputum. She requests that she be discharged as soon as possible given the fact that her husband and her only to people here who can take care of her 2 small children and she has no other family members. Please take this into consideration with regards to disposition. Infectious diseases already aware of the patient and should be seeing them tomorrow. She'll be admitted as above under [facility] and is very stable. 2. ?Potential Htn-monitor trends 3. Grand Multipara-Suggest out-patient Contraceptive counselling  Full Tm 8 LOS 1 day-see note  Time spent: 71  Mahala Menghini [facility] Pager [phone]  If 7PM-7AM, please contact night-coverage [email or web address] Password [facility] 09/19/2012, 11:53 PM

## 2012-09-19 NOTE — ED Provider Notes (Signed)
History     CSN: [id number]  Arrival date & time 09/19/12  1745   First MD Initiated Contact with Patient 09/19/12 2002      Chief Complaint  Patient presents with  . Chest Pain    (Consider location/radiation/quality/duration/timing/severity/associated sxs/prior treatment) HPI Comments: Patient is 2 months postpartum, now having stabbing, left chest pain, intermittently for the past 3, days with associated shortness of breath.  During the pain episodes that last between 10 and 15 minutes.  Pain radiates to her arm and up into her left ear.  She denies previous history of any type of pain like this.  No swelling in her legs showed a normal vaginal delivery at 39 weeks, and 6 days she's been taking over-the-counter ibuprofen, without any relief  Patient is a 36 y.o. female presenting with chest pain. The history is provided by the patient and the spouse. The history is limited by a language barrier. A language interpreter was used.  Chest Pain The chest pain began 2 days ago. Chest pain occurs intermittently. The chest pain is worsening. The pain is associated with breathing. At its most intense, the pain is at 6/10. The pain is currently at 6/10. The severity of the pain is moderate. The quality of the pain is described as stabbing. The pain does not radiate. Chest pain is worsened by deep breathing and certain positions. Pertinent negatives for primary symptoms include no fever, no syncope, no shortness of breath, no cough, no wheezing, no abdominal pain, no nausea, no vomiting and no dizziness.  Pertinent negatives for associated symptoms include no weakness.     Past Medical History  Diagnosis Date  . TB (pulmonary tuberculosis)     diagnosed in 2004    Past Surgical History  Procedure Date  . Cesarean section   . Facial cosmetic surgery     Family History  Problem Relation Age of Onset  . Anesthesia problems Neg Hx   . Hypotension Neg Hx   . Heart disease Mother     heart  attack    History  Substance Use Topics  . Smoking status: Former Smoker    Quit date: 09/21/2007  . Smokeless tobacco: Never Used  . Alcohol Use: No    OB History    Grav Para Term Preterm Abortions TAB SAB Ect Mult Living   6 6 5 1      5       Review of Systems  Constitutional: Negative for fever and chills.  HENT: Positive for neck pain.   Respiratory: Positive for chest tightness. Negative for cough, shortness of breath and wheezing.   Cardiovascular: Positive for chest pain. Negative for leg swelling and syncope.  Gastrointestinal: Negative for nausea, vomiting, abdominal pain, diarrhea and constipation.  Genitourinary: Negative for vaginal bleeding and vaginal discharge.  Musculoskeletal: Negative for back pain.  Skin: Negative for rash and wound.  Neurological: Negative for dizziness, weakness and headaches.    Allergies  Review of patient's allergies indicates no known allergies.  Home Medications   Current Outpatient Rx  Name  Route  Sig  Dispense  Refill  . IBUPROFEN 600 MG PO TABS   Oral   Take 1 tablet (600 mg total) by mouth every 6 (six) hours.   40 tablet   1     BP 124/70  Pulse 63  Temp 97.9 F (36.6 C) (Oral)  Resp 18  Ht 4' 9.87" (1.47 m)  Wt 156 lb 15.5 oz (71.2 kg)  BMI  32.95 kg/m2  SpO2 100%  LMP 09/12/2012  Physical Exam  Constitutional: She is oriented to person, place, and time. She appears well-developed and well-nourished.  HENT:  Head: Normocephalic.       Chronic nasal congestion due to allergies  Eyes: Pupils are equal, round, and reactive to light.  Neck: Normal range of motion.  Cardiovascular: Normal rate.   Pulmonary/Chest: Effort normal and breath sounds normal. She has no wheezes. She exhibits no tenderness.  Abdominal: Soft. She exhibits no distension. There is no tenderness. There is no rebound.    Musculoskeletal: Normal range of motion. She exhibits no edema and no tenderness.  Neurological: She is alert and  oriented to person, place, and time.  Skin: Skin is warm and dry. No pallor.    ED Course  Procedures (including critical care time)  Labs Reviewed  CBC - Abnormal; Notable for the following:    Hemoglobin 10.8 (*)     HCT 32.9 (*)     MCH 25.7 (*)     All other components within normal limits  HEPATIC FUNCTION PANEL - Abnormal; Notable for the following:    Total Bilirubin 0.1 (*)     All other components within normal limits  BASIC METABOLIC PANEL  TROPONIN I  CULTURE, EXPECTORATED SPUTUM-ASSESSMENT  AFB CULTURE WITH SMEAR  AFB CULTURE WITH SMEAR   No results found.   1. History of TB (tuberculosis)       MDM   Concern for PE due to nature of pain and recent delivery        Arman Filter, NP 09/25/12 0015

## 2012-09-20 ENCOUNTER — Other Ambulatory Visit: Payer: Self-pay | Admitting: Internal Medicine

## 2012-09-20 ENCOUNTER — Encounter: Payer: Self-pay | Admitting: Family Medicine

## 2012-09-20 ENCOUNTER — Encounter (HOSPITAL_COMMUNITY): Payer: Self-pay | Admitting: Family Medicine

## 2012-09-20 DIAGNOSIS — Z8611 Personal history of tuberculosis: Principal | ICD-10-CM

## 2012-09-20 MED ORDER — HYDROCODONE-ACETAMINOPHEN 5-325 MG PO TABS: 1.0000 | ORAL_TABLET | ORAL | Status: DC | PRN

## 2012-09-20 MED ORDER — GUAIFENESIN ER 600 MG PO TB12
1200.0000 mg | ORAL_TABLET | Freq: Two times a day (BID) | ORAL | Status: DC
  Administered 2012-09-20 (×2): 1200 mg via ORAL
  Filled 2012-09-20 (×3): qty 2

## 2012-09-20 NOTE — Progress Notes (Signed)
Nutrition Brief Note  Patient identified on the Malnutrition Screening Tool (MST) Report  Body mass index is 32.95 kg/(m^2). Pt meets criteria for class I obesity based on current BMI.   Spoke with pt using Spanish interpreter. Pt reports eating well PTA with good appetite, 2-3 meals/day. Pt is 2 months postpartum and reports she gained a lot of weight during her pregnancy and has had trouble losing the weight. Discussed, using teach back method, healthy diet plan with meal and snack ideas, provided Spanish handout of this information. Pt expressed frustration about being in the hospital r/t stress at home with having 2 kids and needing to take care of them to prevent her husband from losing his job and was wanting to leave against medical advice - notified RN and MD who went in to assess pt. Pt without any further nutritional needs.   Levon Hedger MS, RD, LDN [phone] Pager [phone] After Hours Pager

## 2012-09-22 ENCOUNTER — Encounter: Payer: Self-pay | Admitting: Internal Medicine

## 2012-09-22 ENCOUNTER — Ambulatory Visit (INDEPENDENT_AMBULATORY_CARE_PROVIDER_SITE_OTHER): Payer: Medicaid Other | Admitting: Internal Medicine

## 2012-09-22 VITALS — BP 144/85 | HR 71 | Temp 98.2°F | Ht 59.0 in | Wt 159.0 lb

## 2012-09-22 DIAGNOSIS — Z8611 Personal history of tuberculosis: Secondary | ICD-10-CM

## 2012-09-22 NOTE — Discharge Summary (Signed)
Physician Discharge Summary  [name] XLK:[id number] DOB: 1976-05-30 DOA: 09/19/2012  PCP: No primary provider on file.  Admit date: 09/19/2012 Discharge date: 09/22/2012  Recommendations for Outpatient Follow-up:  1. Patient was instructed to wear mask at all time especially if close to her kids. Patient was instructed to follow up closely with infectious disease. I spoke with Dr. Orvan Falconer of ID and was informed that once results are available they will call the patient and initiate proper treatment.  Discharge Diagnoses:  Active Problems:  History of TB (tuberculosis)   Discharge Condition: please note that the patient has left AMA  Diet recommendation: as tolerated  History of present illness:  36 y.o. female came to [facility] ed 09/19/2012 with chest pain in the left arm and upper chest area. Apparently this has been going on for some time but she did have pain free episodes as well. Patient reported having these pains on and off for couple of months. Her pain was as high as 10/10 in intensity and occasionally radiates to the back. She dose report productive cough but no fever or chills, no weight loss and no night sweats. Patient did take antibiotics for possible pneumonia but her symptoms did not improve.  Hospital Course:   Active Problem:  History of TB (tuberculosis) - CT chest results are highly concerning for active TB - I spoke with ID and recommendation was to not send patient home with antibiotics as she has already received them prior to this admission. If she indeed has TB which we will know after AFB results are available then she will be started on anti TB meds in ID clinic - patient wants leave today due to issues with kids care. Her husband is primary wage earner at home and is at stake of losing job if he does not go to work so she needed to go home so her husband could go to work. - I have explained to the patient the potential risks if she indeed does have TB and  have explained to her the need to follow up with ID. I have provided a box of N-95 masks for her nad have educated her how to wear it. - patient has left AMA   Procedures:  AFB sputums collected  Consultations:  Infectious disease  Discharge Exam: Filed Vitals:   09/20/12 0245  BP: 124/70  Pulse: 63  Temp: 97.9 F (36.6 C)  Resp: 18   Filed Vitals:   09/19/12 1817 09/19/12 2230 09/20/12 0245 09/20/12 0300  BP: 139/82 137/53 124/70   Pulse: 69 63 63   Temp: 98 F (36.7 C)  97.9 F (36.6 C)   TempSrc: Oral  Oral   Resp: 17 16 18    Height:    4' 9.87" (1.47 m)  Weight:    71.2 kg (156 lb 15.5 oz)  SpO2: 100% 98% 100%     General: Pt is alert, follows commands appropriately, not in acute distress Cardiovascular: Regular rate and rhythm, S1/S2 +, no murmurs, no rubs, no gallops Respiratory: Clear to auscultation bilaterally, no wheezing, no crackles, no rhonchi Abdominal: Soft, non tender, non distended, bowel sounds +, no guarding Extremities: no edema, no cyanosis, pulses palpable bilaterally DP and PT Neuro: Grossly nonfocal  Discharge Instructions     Medication List     As of 09/22/2012 10:16 PM    ASK your doctor about these medications         ibuprofen 600 MG tablet   Commonly known as: ADVIL,MOTRIN  Take 1 tablet (600 mg total) by mouth every 6 (six) hours.          The results of significant diagnostics from this hospitalization (including imaging, microbiology, ancillary and laboratory) are listed below for reference.    Significant Diagnostic Studies: Dg Chest 2 View  09/19/2012  *RADIOLOGY REPORT*  Clinical Data: Chest pain  CHEST - 2 VIEW  Comparison: 02/01/2005  Findings: Heart size is normal.  There is no pleural effusion or edema.  Left upper lobe scarring and multiple nodular densities throughout the left lung are again noted and appear progressive from previous exam.  Right lung is relatively clear.  Review of the visualized osseous  structures is unremarkable.  IMPRESSION:  1.  Interstitial nodularity and left apical scarring consistent with the history of TB.  No superimposed airspace consolidation identified.   Original Report Authenticated By: Signa Kell, M.D.    Ct Angio Chest Pe W/cm &/or Wo Cm  09/19/2012  *RADIOLOGY REPORT*  Clinical Data: Left-sided chest pain, radiating down left arm; shortness of breath.  CT ANGIOGRAPHY CHEST  Technique:  Multidetector CT imaging of the chest using the standard protocol during bolus administration of intravenous contrast. Multiplanar reconstructed images including MIPs were obtained and reviewed to evaluate the vascular anatomy.  Contrast: OMNIPAQUE IOHEXOL 350 MG/ML SOLN  Comparison: Chest radiograph performed 02/01/2005  Findings: There is no evidence of significant pulmonary embolus.  There is a prominent multilobulated 4.7 x 4.1 cm mass at the left lung apex, with associated atelectasis.  Additional nodular opacity is noted throughout the left upper and lower lung lobes.  Scattered calcifications within the left apical mass reflect chronic changes, but this is highly suspicious for acute infection.  Additional minimal nodular opacity is noted at the inferior aspect of the right upper lobe.  Given the patient's history of tuberculosis, this is most compatible with active tuberculosis, though a fungal infection could have a similar appearance.  There is no evidence of bronchiectasis to suggest aspergillosis.  There is no evidence of pleural effusion or pneumothorax.  Scattered hilar nodes are borderline normal in size; no definite mediastinal lymphadenopathy is seen.  No pericardial effusion is identified.  The great vessels are grossly unremarkable in appearance.  The thyroid gland is unremarkable.  No axillary lymphadenopathy is appreciated.  The visualized portions of the liver and spleen are unremarkable.  No acute osseous abnormalities are seen.  IMPRESSION:  1.  No evidence of  significant pulmonary embolus. 2.  Prominent multilobulated 4.7 x 4.1 cm mass at the left lung apex, with associated atelectasis.  Additional nodular opacity throughout the left upper and lower lung lobes, with minimal nodular opacity at the inferior aspect of the right upper lobe. This is highly suspicious for acute infection; on comparison with prior chest radiographs, there appears to be acute disease superimposed on the patient's prior findings.  Given the patient's history of tuberculosis, this is most compatible with active tuberculosis, though a fungal infection could have a similar appearance.  No evidence of bronchiectasis to suggest aspergillosis.  These results were called by telephone on 09/19/2012 at 11:25 p.m. to Earley Favor PA, who verbally acknowledged these results.   Original Report Authenticated By: Tonia Ghent, M.D.     Microbiology: Recent Results (from the past 240 hour(s))  CULTURE, EXPECTORATED SPUTUM-ASSESSMENT     Status: Normal   Collection Time   09/20/12 12:50 PM      Component Value Range Status Comment   Specimen Description SPUTUM  Final    Special Requests Normal   Final    Sputum evaluation     Final    Value: MICROSCOPIC FINDINGS SUGGEST THAT THIS SPECIMEN IS NOT REPRESENTATIVE OF LOWER RESPIRATORY SECRETIONS. PLEASE RECOLLECT.     INFORMED K. [facility] RN AT 1317 ON 12.3.13 BY SHUEA   Report Status 09/20/2012 FINAL   Final   AFB CULTURE WITH SMEAR     Status: Normal (Preliminary result)   Collection Time   09/20/12 12:50 PM      Component Value Range Status Comment   Specimen Description SPUTUM   Final    Special Requests NONE   Final    ACID FAST SMEAR NO ACID FAST BACILLI SEEN   Final    Culture     Final    Value: CULTURE WILL BE EXAMINED FOR 6 WEEKS BEFORE ISSUING A FINAL REPORT   Report Status PENDING   Incomplete      Labs: Basic Metabolic Panel:  Lab 09/19/12 1610  NA 136  K 3.8  CL 99  CO2 26  GLUCOSE 83  BUN 15  CREATININE 0.70   CALCIUM 9.1  MG --  PHOS --   Liver Function Tests:  Lab 09/19/12 2024  AST 27  ALT 28  ALKPHOS 84  BILITOT 0.1*  PROT 7.6  ALBUMIN 3.6   No results found for this basename: LIPASE:5,AMYLASE:5 in the last 168 hours No results found for this basename: AMMONIA:5 in the last 168 hours CBC:  Lab 09/19/12 1857  WBC 7.2  NEUTROABS --  HGB 10.8*  HCT 32.9*  MCV 78.1  PLT 284   Cardiac Enzymes:  Lab 09/19/12 1857  CKTOTAL --  CKMB --  CKMBINDEX --  TROPONINI <0.30   BNP: BNP (last 3 results) No results found for this basename: PROBNP:3 in the last 8760 hours CBG: No results found for this basename: GLUCAP:5 in the last 168 hours  Time coordinating discharge: Over 30 minutes  Signed:  Manson Passey, MD  [facility] 09/22/2012, 10:16 PM  Pager #: [phone]

## 2012-09-22 NOTE — Assessment & Plan Note (Signed)
She comes in here for evaluation. She did get a sputum done in the hospital but it was not satisfactory for evaluation. The smear for AFB was negative. She tells me she does not have much phlegm or productive sputum. She is not currently having any cough or any other symptoms. She does not have insurance. This has been discussed with the health Department who is going to followup with her. Most likely she needs evaluation with bronchoscopy if possible. This or induced sputum. She is not acutely ill suggesting any kind of bacterial infection. Other types of infections were described to her. She understands the severity of this this infection if positive or any other type of infection it may be. She understands the need to wear a mask particularly with young children. Her information was provided to the health department.

## 2012-09-22 NOTE — Progress Notes (Signed)
  Subjective:    Patient ID: [name], female    DOB: 10-02-1976, 36 y.o.   MRN: [id number]  HPI She comes in for hospital followup. She is here to followup findings suspicious for possible tuberculosis. She has a history of tuberculosis in 2004 and apparently completed one year of treatment with multiple medications. This was done in [place]. She otherwise has done well until recently and noted some left-sided pleuritic chest pain. She states that she had pain that was similar when she had tuberculosis and had subsided but has been increasingly worse recently. She denies fever or chills. No weight loss.   Review of Systems  Constitutional: Negative for fever, chills, fatigue and unexpected weight change.  Cardiovascular:       Pleuritic chest pain  Gastrointestinal: Negative for diarrhea.  Skin: Negative for rash.       Objective:   Physical Exam  Constitutional: She appears well-developed and well-nourished. No distress.  Cardiovascular: Normal rate, regular rhythm and normal heart sounds.  Exam reveals no gallop and no friction rub.   No murmur heard. Pulmonary/Chest: Effort normal and breath sounds normal. No respiratory distress. She has no wheezes. She has no rales.          Assessment & Plan:

## 2012-09-26 NOTE — ED Provider Notes (Signed)
Medical screening examination/treatment/procedure(s) were performed by non-physician practitioner and as supervising physician I was immediately available for consultation/collaboration.  Madelyn Tlatelpa R. Luiz Trumpower, MD 09/26/12 1556 

## 2012-10-17 ENCOUNTER — Telehealth: Payer: Self-pay | Admitting: Licensed Clinical Social Worker

## 2012-10-17 NOTE — Telephone Encounter (Signed)
Malachi Bonds called from [facility] to report that this patient had a positive AFB culture for MAC.

## 2012-10-20 NOTE — Telephone Encounter (Signed)
Ok thanks 

## 2012-10-20 NOTE — Telephone Encounter (Signed)
[name],  It looks like this report needs to be sent to [facility].  Jonny Ruiz

## 2012-10-25 ENCOUNTER — Telehealth: Payer: Self-pay | Admitting: *Deleted

## 2012-10-25 ENCOUNTER — Ambulatory Visit: Payer: Self-pay | Admitting: Internal Medicine

## 2012-10-25 NOTE — Telephone Encounter (Signed)
Spoke with Waldron Session at [facility] where patient is being treated for TB. Informed her that patient has a positive lab for MAC and needs treatment. She has an appt today at 4:30 pm, faxed lab results and treatment recommendation per Dr.Comer to [phone]. Wendall Mola CMA

## 2012-10-25 NOTE — Telephone Encounter (Signed)
Left message via interpreter line for patient to call the office to reschedule appointment, she no showed today. Wendall Mola CMA

## 2012-10-25 NOTE — Telephone Encounter (Signed)
Message copied by Macy Mis on Tue Oct 25, 2012  2:50 PM ------      Message from: Gardiner Barefoot      Created: Tue Oct 25, 2012  2:03 PM       I received a call from the health department, Dr. Burnice Logan, she is being treated presumptively for Tb, but no positive cultures from them.  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  The HD will not provide the medicines since it is now known to be non-communicable.  She will need to be seen and scheduled asap.  They will try to communicate this with her today at the appointment.              Thanks

## 2012-10-25 NOTE — Telephone Encounter (Signed)
Patient needs to be scheduled for appointment, the health department will try to arrange this. [name]

## 2012-10-28 ENCOUNTER — Telehealth: Payer: Self-pay | Admitting: *Deleted

## 2012-11-01 ENCOUNTER — Encounter: Payer: Self-pay | Admitting: Internal Medicine

## 2012-11-01 ENCOUNTER — Inpatient Hospital Stay: Payer: Self-pay | Admitting: Internal Medicine

## 2012-11-01 ENCOUNTER — Ambulatory Visit (INDEPENDENT_AMBULATORY_CARE_PROVIDER_SITE_OTHER): Payer: Self-pay | Admitting: Internal Medicine

## 2012-11-01 VITALS — BP 133/83 | HR 80 | Temp 98.1°F | Wt 159.0 lb

## 2012-11-01 DIAGNOSIS — A318 Other mycobacterial infections: Secondary | ICD-10-CM

## 2012-11-01 DIAGNOSIS — A310 Pulmonary mycobacterial infection: Secondary | ICD-10-CM | POA: Insufficient documentation

## 2012-11-01 MED ORDER — ETHAMBUTOL HCL 400 MG PO TABS: 400.0000 mg | ORAL_TABLET | ORAL | Status: DC

## 2012-11-01 MED ORDER — AZITHROMYCIN 500 MG PO TABS: 500.0000 mg | ORAL_TABLET | ORAL | Status: DC

## 2012-11-01 MED ORDER — RIFAMPIN 300 MG PO CAPS: 600.0000 mg | ORAL_CAPSULE | ORAL | Status: DC

## 2012-11-01 NOTE — Progress Notes (Signed)
  Subjective:    Patient ID: [name], female    DOB: [birth year], 37 y.o.   MRN: [id number]  HPI She comes in here for reevaluation for her pulmonary opacities. She was hospitalized for left-sided pleuritic chest pain and on x-ray noted to have multifocal lesions and underwent an evaluation. Initially it was notable for positive AFB culture and she was sent to the health Department since she has a history of tuberculosis. There, she was treated empirically for tuberculosis with the undocumented history and positive PPD. Subsequently, the AFB culture grew mycobacterial avium complex and she returns here for consideration of treatment of that. Since the culture grew this, she is no longer going to the health department. She continues to have the same pleuritic chest pain but no significant cough, weight loss, night sweats or shortness of breath.   Review of Systems  Constitutional: Negative for fever, chills, appetite change and unexpected weight change.  HENT: Negative for sore throat and trouble swallowing.   Respiratory: Negative for cough and shortness of breath.   Cardiovascular: Negative for chest pain, palpitations and leg swelling.  Gastrointestinal: Negative for nausea, abdominal pain and diarrhea.  Musculoskeletal: Negative for myalgias and arthralgias.  Skin: Negative for rash.  Neurological: Negative for dizziness.       Objective:   Physical Exam  Constitutional: She appears well-developed and well-nourished. No distress.  Cardiovascular: Normal rate, regular rhythm and normal heart sounds.  Exam reveals no gallop and no friction rub.   No murmur heard. Pulmonary/Chest: Effort normal and breath sounds normal. No respiratory distress. She has no wheezes. She has no rales.          Assessment & Plan:

## 2012-11-01 NOTE — Assessment & Plan Note (Signed)
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 I have discussed treatment options and I do feel since she has significant findings on her CT scan that she should undergo treatment. Since this is not cavitary and she is only symptomatic with some mild pleuritic chest pain, I feel 3 times a week therapy is adequate. I discussed treatment options with her and with the pharmacist and we have found affordable options for her to get ethambutol, rifampin and azithromycin. She is going to start this tomorrow and continue on a Monday Wednesday and Friday schedule and I anticipate for 6 months at least. I will followup with her in one month to assure tolerance though she knows to call sooner if she develops any allergy symptoms including rash or significant abdominal pain. She also is aware of vision changes that can also occur. Interview was done in Spanish and the patient voiced her understanding

## 2012-11-01 NOTE — Patient Instructions (Signed)
puede empezar manana, tomando cada lunes, mierocoles y viernes.  Vuelve en un mes

## 2012-11-03 NOTE — Telephone Encounter (Signed)
Patient called clinic asking to reschedule her missed appointment. Upon discussion with Dr. Luciana Axe, she was double booked with him for 1/14. Used [employer] for communication with patient. She voiced understanding. Andree Coss, RN

## 2012-11-07 LAB — AFB CULTURE WITH SMEAR (NOT AT ARMC): Acid Fast Smear: NONE SEEN

## 2012-12-01 ENCOUNTER — Ambulatory Visit: Payer: Self-pay | Admitting: Internal Medicine

## 2012-12-08 ENCOUNTER — Encounter: Payer: Self-pay | Admitting: Internal Medicine

## 2012-12-08 ENCOUNTER — Ambulatory Visit (INDEPENDENT_AMBULATORY_CARE_PROVIDER_SITE_OTHER): Payer: Medicaid Other | Admitting: Internal Medicine

## 2012-12-08 VITALS — BP 114/73 | HR 73 | Temp 98.5°F | Wt 157.0 lb

## 2012-12-08 DIAGNOSIS — A310 Pulmonary mycobacterial infection: Secondary | ICD-10-CM

## 2012-12-08 NOTE — Assessment & Plan Note (Signed)
She is doing well as her regimen though I am concerned with the blurry vision and a side effect of ethambutol. It would be less common to be bilateral and is only blurry vision but certainly is possible. I will have her continue with the medication for now but get an eye exam scheduled ASAP. If the eye exam is not able to be scheduled I will have her hold the medication. I am hesitant to stop the medication as they're no viable options for her it better is good and that her also affordable.  If it does persist though, I will be hesitant to continue that. I will wait for the ophthalmology exam first. She will return in 2 months. At that time we'll consider repeat chest x-ray to see if there is any change compared to the CT scan or previous chest x-ray.

## 2012-12-08 NOTE — Progress Notes (Signed)
  Subjective:    Patient ID: [name], female    DOB: 1975/10/21, 37 y.o.   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 Last visit due to the mass size and her symptoms I started her on therapy with ethambutol, azithromycin and rifampin. She is taking this 3 days a week and has completed one month. She is going back today for her second month of which is provided through the [facility] pharmacy at a discounted rate. She has good tolerance to medication. She does endorse some blurriness of vision bilateral that has been going on for about a week. She does tell me she has had this in the past though resolved and is back again. No pain in her eyes though and no other vision problems.   Review of Systems  Constitutional: Negative for fever and fatigue.  Respiratory: Negative for cough, chest tightness, shortness of breath and wheezing.        Objective:   Physical Exam  Constitutional: She appears well-developed and well-nourished. No distress.  Cardiovascular: Normal rate, regular rhythm and normal heart sounds.  Exam reveals no gallop and no friction rub.   No murmur heard. Pulmonary/Chest: Effort normal and breath sounds normal. No respiratory distress. She has no wheezes.          Assessment & Plan:

## 2012-12-09 ENCOUNTER — Telehealth: Payer: Self-pay | Admitting: *Deleted

## 2012-12-09 ENCOUNTER — Other Ambulatory Visit: Payer: Self-pay | Admitting: Internal Medicine

## 2012-12-09 MED ORDER — LEVOFLOXACIN 500 MG PO TABS: 500.0000 mg | ORAL_TABLET | ORAL | Status: DC

## 2012-12-09 NOTE — Telephone Encounter (Signed)
Spoke with [place] at [employer] and the [employer] works through [facility] and they do not provide services to undocumented people. She would need an ID, I spoke with the interpreter and she said the patient does not have citizenship.  [name]

## 2012-12-09 NOTE — Telephone Encounter (Signed)
Spoke with Ashby Dawes, Interpreter, and she will notify patient of change in medication. Levaquin to take the place of ethambutol. Wendall Mola

## 2012-12-09 NOTE — Telephone Encounter (Signed)
Then I think she should stop the ethambutol.  In its place she should take levaquin 500 mg three times a week.  I will send it to the same pharmacy.  Hopefully not too expensive.  thanks

## 2013-01-27 ENCOUNTER — Inpatient Hospital Stay (HOSPITAL_COMMUNITY)
Admission: AD | Admit: 2013-01-27 | Discharge: 2013-01-27 | Disposition: A | Discharge status: Home / Self Care | Payer: Self-pay | Source: Ambulatory Visit | Attending: Obstetrics & Gynecology | Admitting: Obstetrics & Gynecology

## 2013-01-27 ENCOUNTER — Encounter (HOSPITAL_COMMUNITY): Payer: Self-pay | Admitting: Family

## 2013-01-27 ENCOUNTER — Inpatient Hospital Stay (HOSPITAL_COMMUNITY): Payer: Self-pay

## 2013-01-27 DIAGNOSIS — O039 Complete or unspecified spontaneous abortion without complication: Secondary | ICD-10-CM | POA: Insufficient documentation

## 2013-01-27 DIAGNOSIS — R109 Unspecified abdominal pain: Secondary | ICD-10-CM | POA: Insufficient documentation

## 2013-01-27 LAB — CBC
Hemoglobin: 11.7 g/dL — ABNORMAL LOW (ref 12.0–15.0)
MCH: 25.9 pg — ABNORMAL LOW (ref 26.0–34.0)
RBC: 4.51 MIL/uL (ref 3.87–5.11)

## 2013-01-27 LAB — URINALYSIS, ROUTINE W REFLEX MICROSCOPIC
Bilirubin Urine: NEGATIVE
Ketones, ur: NEGATIVE mg/dL
Nitrite: NEGATIVE
Urobilinogen, UA: 0.2 mg/dL (ref 0.0–1.0)
pH: 6.5 (ref 5.0–8.0)

## 2013-01-27 LAB — WET PREP, GENITAL
Clue Cells Wet Prep HPF POC: NONE SEEN
Yeast Wet Prep HPF POC: NONE SEEN

## 2013-01-27 LAB — URINE MICROSCOPIC-ADD ON

## 2013-01-27 MED ORDER — IBUPROFEN 800 MG PO TABS: 800.0000 mg | ORAL_TABLET | Freq: Three times a day (TID) | ORAL | Status: DC | PRN

## 2013-01-27 MED ORDER — PROMETHAZINE HCL 25 MG PO TABS: 25.0000 mg | ORAL_TABLET | Freq: Four times a day (QID) | ORAL | Status: DC | PRN

## 2013-01-27 MED ORDER — MISOPROSTOL 200 MCG PO TABS
800.0000 ug | ORAL_TABLET | Freq: Once | ORAL | Status: AC
  Administered 2013-01-27: 800 ug via VAGINAL
  Filled 2013-01-27: qty 4

## 2013-01-27 MED ORDER — OXYCODONE-ACETAMINOPHEN 5-325 MG PO TABS
2.0000 | ORAL_TABLET | Freq: Once | ORAL | Status: AC
  Administered 2013-01-27: 2 via ORAL
  Filled 2013-01-27: qty 2

## 2013-01-27 MED ORDER — OXYCODONE-ACETAMINOPHEN 5-325 MG PO TABS: 1.0000 | ORAL_TABLET | ORAL | Status: DC | PRN

## 2013-01-27 MED ORDER — KETOROLAC TROMETHAMINE 60 MG/2ML IM SOLN
60.0000 mg | Freq: Once | INTRAMUSCULAR | Status: AC
  Administered 2013-01-27: 60 mg via INTRAMUSCULAR
  Filled 2013-01-27: qty 2

## 2013-01-27 NOTE — MAU Note (Signed)
Patient states she has had a positive pregnancy test at the health department. States today she had a mild pain and started having vaginal bleeding. Patient has on a pad that is covered with bright red blood. In the bathroom with the patient moderate, active bleeding is noted. Peri care and clean pads and panties applied. Slight abdominal pain at this time.

## 2013-01-27 NOTE — MAU Note (Addendum)
Patient presents to MAU with vaginal bleeding since 1400; reports saturating one pad/diaper at home. Reports intermittent lower abdominal cramping.

## 2013-01-27 NOTE — MAU Provider Note (Signed)
History     CSN: [id number]  Arrival date and time: 01/27/13 1458   First Provider Initiated Contact with Patient 01/27/13 1624      Chief Complaint  Patient presents with  . Possible Pregnancy  . Vaginal Bleeding   HPI 37 y.o. [id number] at  13 weeks by LMP presents with heavy vaginal bleeding and cramping starting today. Had positive pregnancy test at home in January. Patient's last menstrual period was 10/28/2012.    Past Medical History  Diagnosis Date  . TB (pulmonary tuberculosis)     diagnosed in 2004    Past Surgical History  Procedure Laterality Date  . Cesarean section    . Facial cosmetic surgery      Family History  Problem Relation Age of Onset  . Anesthesia problems Neg Hx   . Hypotension Neg Hx   . Heart disease Mother     heart attack    History  Substance Use Topics  . Smoking status: Former Smoker    Quit date: 09/21/2007  . Smokeless tobacco: Never Used  . Alcohol Use: No    Allergies: No Known Allergies  Prescriptions prior to admission  Medication Sig Dispense Refill  . Prenatal Vit-Fe Fumarate-FA (PRENATAL MULTIVITAMIN) TABS Take 1 tablet by mouth daily at 12 noon.      . [DISCONTINUED] azithromycin (ZITHROMAX) 500 MG tablet Take 1 tablet (500 mg total) by mouth 3 (three) times a week.  12 tablet  5  . [DISCONTINUED] ibuprofen (ADVIL,MOTRIN) 600 MG tablet Take 1 tablet (600 mg total) by mouth every 6 (six) hours.  40 tablet  1  . [DISCONTINUED] levofloxacin (LEVAQUIN) 500 MG tablet Take 1 tablet (500 mg total) by mouth 3 (three) times a week. Take with azithromycin and rifampin, stop ethambutol  12 tablet  11  . [DISCONTINUED] rifampin (RIFADIN) 300 MG capsule Take 2 capsules (600 mg total) by mouth 3 (three) times a week.  24 capsule  5    Review of Systems  Constitutional: Negative.   Respiratory: Negative.   Cardiovascular: Negative.   Gastrointestinal: Negative for nausea, vomiting, abdominal pain, diarrhea and constipation.   Genitourinary: Negative for dysuria, urgency, frequency, hematuria and flank pain.       Positive for vaginal bleeding and cramping   Musculoskeletal: Negative.   Neurological: Negative.   Psychiatric/Behavioral: Negative.    Physical Exam   Blood pressure 117/68, pulse 76, temperature 98.2 F (36.8 C), temperature source Oral, resp. rate 20, last menstrual period 10/28/2012, SpO2 100.00%.  Physical Exam  Nursing note and vitals reviewed. Constitutional: She is oriented to person, place, and time. She appears well-developed and well-nourished. No distress.  Cardiovascular: Normal rate.   Respiratory: Effort normal.  GI: Soft. There is no tenderness.  Genitourinary: Uterus is enlarged. Uterus is not tender. There is bleeding (moderate to heavy) around the vagina.  Cervix open about 1 cm, initially palpated possible gestational sac in cervix, then patient reported gush of blood coming out, sac no longer palpable at cervix, small flesh colored tissue passed with gush of blood  Musculoskeletal: Normal range of motion.  Neurological: She is alert and oriented to person, place, and time.  Skin: Skin is warm and dry.  Psychiatric: She has a normal mood and affect.    MAU Course  Procedures  Results for orders placed during the hospital encounter of 01/27/13 (from the past 24 hour(s))  URINALYSIS, ROUTINE W REFLEX MICROSCOPIC     Status: Abnormal   Collection Time  01/27/13  3:30 PM      Result Value Range   Color, Urine AMBER (*) YELLOW   APPearance CLEAR  CLEAR   Specific Gravity, Urine <1.005 (*) 1.005 - 1.030   pH 6.5  5.0 - 8.0   Glucose, UA NEGATIVE  NEGATIVE mg/dL   Hgb urine dipstick LARGE (*) NEGATIVE   Bilirubin Urine NEGATIVE  NEGATIVE   Ketones, ur NEGATIVE  NEGATIVE mg/dL   Protein, ur NEGATIVE  NEGATIVE mg/dL   Urobilinogen, UA 0.2  0.0 - 1.0 mg/dL   Nitrite NEGATIVE  NEGATIVE   Leukocytes, UA NEGATIVE  NEGATIVE  URINE MICROSCOPIC-ADD ON     Status: None    Collection Time    01/27/13  3:30 PM      Result Value Range   Squamous Epithelial / LPF RARE  RARE   RBC / HPF 21-50  <3 RBC/hpf  POCT PREGNANCY, URINE     Status: Abnormal   Collection Time    01/27/13  3:33 PM      Result Value Range   Preg Test, Ur POSITIVE (*) NEGATIVE  HCG, QUANTITATIVE, PREGNANCY     Status: Abnormal   Collection Time    01/27/13  4:28 PM      Result Value Range   hCG, Beta Chain, Quant, S 4860 (*) <5 mIU/mL  CBC     Status: Abnormal   Collection Time    01/27/13  4:28 PM      Result Value Range   WBC 8.1  4.0 - 10.5 K/uL   RBC 4.51  3.87 - 5.11 MIL/uL   Hemoglobin 11.7 (*) 12.0 - 15.0 g/dL   HCT 59.5 (*) 63.8 - 75.6 %   MCV 78.3  78.0 - 100.0 fL   MCH 25.9 (*) 26.0 - 34.0 pg   MCHC 33.1  30.0 - 36.0 g/dL   RDW 43.3 (*) 29.5 - 18.8 %   Platelets 220  150 - 400 K/uL  WET PREP, GENITAL     Status: Abnormal   Collection Time    01/27/13  4:36 PM      Result Value Range   Yeast Wet Prep HPF POC NONE SEEN  NONE SEEN   Trich, Wet Prep NONE SEEN  NONE SEEN   Clue Cells Wet Prep HPF POC NONE SEEN  NONE SEEN   WBC, Wet Prep HPF POC FEW (*) NONE SEEN   US Ob Comp Less 14 Wks  01/27/2013  *RADIOLOGY REPORT*  Clinical Data: 37 year old G six P 5, LMP 10/28/2012 (13 weeks 0 days), presenting with heavy vaginal bleeding.  Quantitative beta HCG 4860.  OBSTETRIC <14 WK [place] AND TRANSVAGINAL OB US  Technique:  Both transabdominal and transvaginal ultrasound examinations were performed for complete evaluation of the gestation as well as the maternal uterus, adnexal regions, and pelvic cul-de-sac.  Transvaginal technique was performed to assess early pregnancy.  Comparison:  None this gestation.  Intrauterine gestational sac:  Not visualized. Yolk sac: Not visualized. Embryo: Not visualized. Cardiac Activity: Not visualized. Heart Rate: Not applicable.  MSD: Not applicable. CRL: Not applicable. [place] EDC: Not applicable.  Maternal uterus/adnexae: According to the ultrasound  technologist, the patient was heavily bleeding actively during the examination.  Heterogeneous material within the endometrial canal from the fundus to the cervix which does not demonstrate color flow.  Right ovary normal in size measuring approximately 2.3 x 2.0 x 2.6 cm, containing an approximate 2 cm corpus luteum cyst.  Left ovary normal in size  measuring approximately 2.2 x 1.4 x 2.1 cm, containing small follicular cysts.  Normal Doppler flow in both ovaries.  No adnexal masses or free pelvic fluid.  IMPRESSION:  1.  No evidence of intrauterine gestational sac. 2.  Heterogeneous material in the endometrial canal consistent with clot in this patient who is actively bleeding. 3.  Normal-appearing ovaries bilaterally with a 2 cm corpus luteum cyst in the right ovary.   Original Report Authenticated By: Hulan Saas, M.D.    US Ob Transvaginal  01/27/2013  *RADIOLOGY REPORT*  Clinical Data: 37 year old G six P 5, LMP 10/28/2012 (13 weeks 0 days), presenting with heavy vaginal bleeding.  Quantitative beta HCG 4860.  OBSTETRIC <14 WK [place] AND TRANSVAGINAL OB US  Technique:  Both transabdominal and transvaginal ultrasound examinations were performed for complete evaluation of the gestation as well as the maternal uterus, adnexal regions, and pelvic cul-de-sac.  Transvaginal technique was performed to assess early pregnancy.  Comparison:  None this gestation.  Intrauterine gestational sac:  Not visualized. Yolk sac: Not visualized. Embryo: Not visualized. Cardiac Activity: Not visualized. Heart Rate: Not applicable.  MSD: Not applicable. CRL: Not applicable. [place] EDC: Not applicable.  Maternal uterus/adnexae: According to the ultrasound technologist, the patient was heavily bleeding actively during the examination.  Heterogeneous material within the endometrial canal from the fundus to the cervix which does not demonstrate color flow.  Right ovary normal in size measuring approximately 2.3 x 2.0 x 2.6 cm, containing  an approximate 2 cm corpus luteum cyst.  Left ovary normal in size measuring approximately 2.2 x 1.4 x 2.1 cm, containing small follicular cysts.  Normal Doppler flow in both ovaries.  No adnexal masses or free pelvic fluid.  IMPRESSION:  1.  No evidence of intrauterine gestational sac. 2.  Heterogeneous material in the endometrial canal consistent with clot in this patient who is actively bleeding. 3.  Normal-appearing ovaries bilaterally with a 2 cm corpus luteum cyst in the right ovary.   Original Report Authenticated By: Hulan Saas, M.D.    Dr. Emelda Fear rev'd u/s images - recommends cytotec for incomplete AB. With consent from patient, Cytotec 800 mcg placed vaginally prior to d/c.   Assessment and Plan   1. SAB (spontaneous abortion)   Cytotec in MAU. Precautions rev'd. Rx for percocet, motrin and phenergan. Will return to clinic in 1 week for repeat quant.     Medication List    STOP taking these medications       azithromycin 500 MG tablet  Commonly known as:  ZITHROMAX     levofloxacin 500 MG tablet  Commonly known as:  LEVAQUIN     rifampin 300 MG capsule  Commonly known as:  RIFADIN      TAKE these medications       ibuprofen 800 MG tablet  Commonly known as:  ADVIL,MOTRIN  Take 1 tablet (800 mg total) by mouth every 8 (eight) hours as needed for pain.     oxyCODONE-acetaminophen 5-325 MG per tablet  Commonly known as:  PERCOCET/ROXICET  Take 1-2 tablets by mouth every 4 (four) hours as needed for pain.     prenatal multivitamin Tabs  Take 1 tablet by mouth daily at 12 noon.     promethazine 25 MG tablet  Commonly known as:  PHENERGAN  Take 1 tablet (25 mg total) by mouth every 6 (six) hours as needed for nausea.            Follow-up Information   Follow up with  [facility] In 1 week. (someone will call with appointment)    Contact information:   [address] [place] [address] [phone]        [facility] 01/27/2013,  4:36 PM

## 2013-01-28 LAB — GC/CHLAMYDIA PROBE AMP: CT Probe RNA: NEGATIVE

## 2013-01-30 NOTE — MAU Provider Note (Signed)
Attestation of Attending Supervision of Advanced Practitioner (CNM/NP): Evaluation and management procedures were performed by the Advanced Practitioner under my supervision and collaboration.  I have reviewed the Advanced Practitioner's note and chart, and I agree with the management and plan.  HARRAWAY-SMITH, Karey Suthers 5:49 PM     

## 2013-02-02 ENCOUNTER — Other Ambulatory Visit: Payer: Self-pay

## 2013-02-02 ENCOUNTER — Inpatient Hospital Stay (HOSPITAL_COMMUNITY)
Admission: AD | Admit: 2013-02-02 | Discharge: 2013-02-02 | Disposition: A | Discharge status: Home / Self Care | Payer: Self-pay | Source: Ambulatory Visit | Attending: Obstetrics & Gynecology | Admitting: Obstetrics & Gynecology

## 2013-02-02 ENCOUNTER — Telehealth: Payer: Self-pay | Admitting: Obstetrics & Gynecology

## 2013-02-02 DIAGNOSIS — O039 Complete or unspecified spontaneous abortion without complication: Secondary | ICD-10-CM

## 2013-02-02 LAB — HCG, QUANTITATIVE, PREGNANCY: hCG, Beta Chain, Quant, S: 43 m[IU]/mL — ABNORMAL HIGH (ref <5)

## 2013-02-02 NOTE — Progress Notes (Signed)
S:  Pt seen in MAU on 01/27/13 and diagnosed with spontaneous abortion based on pelvic exam and ultrasound.  Pt given cytotec that day.  Here today for follow-up BHCG.  Denies pelvic pain or vaginal bleeding.  O:   BP 142/75  Pulse 62  Temp(Src) 98 F (36.7 C) (Oral)  Resp 18  LMP 10/28/2012 General appearance: alert, cooperative and appears stated age Abdomen: normal findings: soft, non-tender  A: Repeat BHCG  Results for orders placed during the hospital encounter of 02/02/13 (from the past 24 hour(s))  HCG, QUANTITATIVE, PREGNANCY     Status: Abnormal   Collection Time    02/02/13  6:44 PM      Result Value Range   hCG, Beta Chain, Quant, S 43 (*) <5 mIU/mL   P: DC to home Follow-up in 48 hours. [facility]

## 2013-02-02 NOTE — Telephone Encounter (Signed)
With the Interpreter I was able to call patient and explain why she needed to come in today per RN. Patient stated she would try to get her, but it would probably be after 5. Per RN patient was told to go to MAU if she could not get here in time.

## 2013-02-02 NOTE — MAU Note (Signed)
Had bleeding while here, was given cytotec.  Pt was unable to get to clinic before 5 was told to come here.  Small amt of bleeding. Pain at times.

## 2013-02-06 ENCOUNTER — Ambulatory Visit (INDEPENDENT_AMBULATORY_CARE_PROVIDER_SITE_OTHER): Payer: Self-pay | Admitting: Internal Medicine

## 2013-02-06 ENCOUNTER — Encounter: Payer: Self-pay | Admitting: Internal Medicine

## 2013-02-06 VITALS — BP 124/82 | HR 75 | Temp 98.1°F | Wt 157.0 lb

## 2013-02-06 DIAGNOSIS — A318 Other mycobacterial infections: Secondary | ICD-10-CM

## 2013-02-06 DIAGNOSIS — A310 Pulmonary mycobacterial infection: Secondary | ICD-10-CM

## 2013-02-06 MED ORDER — AZITHROMYCIN 500 MG PO TABS: 500.0000 mg | ORAL_TABLET | ORAL | Status: DC

## 2013-02-06 MED ORDER — ETHAMBUTOL HCL 400 MG PO TABS: 1000.0000 mg | ORAL_TABLET | ORAL | Status: DC

## 2013-02-06 MED ORDER — RIFAMPIN 300 MG PO CAPS: 600.0000 mg | ORAL_CAPSULE | ORAL | Status: DC

## 2013-02-06 NOTE — Progress Notes (Signed)
  Subjective:    Patient ID: [name], female    DOB: June 16, 1976, 37 y.o.   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 She was first seen in January when her culture grew out after initially being treated for tuberculosis based on her history. I then started her on 3 drug therapy 3 days a week with rifampin, 8 azithromycin and ethambutol. She though did develop some vision changes and I was going to change her ethambutol to Levaquin however after evaluation, she was found to be pregnant and underwent a spontaneous miscarriage. She did have birth control with an IUD though this was coming out. She was told to stop her medications and she comes in here off of her medications. She never did start Levaquin. She though is interested in restarting her medications. Her vision changes did resolve and she does equate these to having been pregnant and more related to dizziness rather than true vision changes.   Review of Systems  Constitutional: Negative for fever, chills, appetite change and fatigue.  Respiratory: Negative for cough and shortness of breath.   Cardiovascular: Negative for chest pain, palpitations and leg swelling.  Gastrointestinal: Negative for nausea, abdominal pain and diarrhea.  Skin: Negative for rash.  Neurological: Negative for dizziness, light-headedness and headaches.       Objective:   Physical Exam  Constitutional: She appears well-developed and well-nourished. No distress.  Cardiovascular: Normal rate, regular rhythm and normal heart sounds.  Exam reveals no gallop and no friction rub.   No murmur heard. Pulmonary/Chest: Effort normal and breath sounds normal. No respiratory distress. She has no wheezes. She has no rales.          Assessment & Plan:

## 2013-02-06 NOTE — Assessment & Plan Note (Signed)
She now is not pregnant, she is going to get birth control from her primary gynecologist. I will have her restart her original medications with a Zithromax and, rifampin and ethambutol. I am going to have her come back in 2-3 weeks to assure she is not having any significant vision problems with her regimen or any other side effects. She does get her medications from the [facility] pharmacy through a drug assistance program.

## 2013-02-27 ENCOUNTER — Ambulatory Visit (INDEPENDENT_AMBULATORY_CARE_PROVIDER_SITE_OTHER): Payer: Self-pay | Admitting: Internal Medicine

## 2013-02-27 ENCOUNTER — Encounter: Payer: Self-pay | Admitting: Internal Medicine

## 2013-02-27 VITALS — BP 109/77 | HR 76 | Temp 98.3°F | Ht <= 58 in | Wt 154.5 lb

## 2013-02-27 DIAGNOSIS — A310 Pulmonary mycobacterial infection: Secondary | ICD-10-CM

## 2013-02-27 DIAGNOSIS — A318 Other mycobacterial infections: Secondary | ICD-10-CM
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
## 2013-03-27 ENCOUNTER — Encounter: Payer: Self-pay | Admitting: Internal Medicine

## 2013-03-27 ENCOUNTER — Ambulatory Visit (INDEPENDENT_AMBULATORY_CARE_PROVIDER_SITE_OTHER): Payer: Self-pay | Admitting: Internal Medicine

## 2013-03-27 VITALS — BP 108/73 | HR 72 | Temp 98.2°F

## 2013-03-27 DIAGNOSIS — A310 Pulmonary mycobacterial infection: Secondary | ICD-10-CM

## 2013-03-27 DIAGNOSIS — A318 Other mycobacterial infections: Secondary | ICD-10-CM

## 2013-03-27 MED ORDER — LEVOFLOXACIN 500 MG PO TABS: 500.0000 mg | ORAL_TABLET | Freq: Every day | ORAL | Status: DC

## 2013-03-27 NOTE — Patient Instructions (Signed)
Toma Azithromycin, Rifampin y levaquin cada lunes miercoles y viernes.   Vuelve en 1 mes

## 2013-03-27 NOTE — Progress Notes (Signed)
  Subjective:    Patient ID: [name], female    DOB: [birth year], 37 y.o.   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 She was initially started on azithromycin, rifampin and ethambutol 3 times a week after diagnosis. She was initially being treated by the health department for presumed tuberculosis based on her history and the concern with her upper lobe lesion. It though did grow Mycobacterium avium complex. After about one month with the MAC therapy, she had some vision changes and so her ethambutol was changed to Levaquin. She however never started it and found out she was pregnant. This was despite having a IUD. She then had a spontaneous abortion. After discussion with her after the pregnancy, it did seem that most of her vision changes were actually lightheadedness and dizziness and likely related to pregnancy. She was then started again on 3 drug therapy including ethambutol. However after a few doses, she noted significant pain in her arm and, chest and back muscles that brought her to tears. She attributed it to ethambutol. She said she stopped for a while and then tried again and the same thing happened. No vision changes, no weight loss. She does continue to have some subjective dyspnea. She was then seen last month with these complaints and was going to start azithromycin, rifampin and Levaquin however she tells me she never heard back whether or not she should start.   Review of Systems  Constitutional: Negative for fever, fatigue and unexpected weight change.  Respiratory: Negative for cough and shortness of breath.   Cardiovascular: Positive for chest pain.       Musculoskeletal  Gastrointestinal: Negative for nausea and diarrhea.  Skin: Negative for rash.  Neurological: Negative for dizziness and headaches.  Hematological: Negative for adenopathy.       Objective:   Physical Exam  Constitutional: She appears well-developed and  well-nourished. No distress.  Eyes: No scleral icterus.  Cardiovascular: Normal rate, regular rhythm and normal heart sounds.   No murmur heard. Pulmonary/Chest: Effort normal and breath sounds normal. No respiratory distress. She has no wheezes. She has no rales.  Skin: Skin is warm and dry. No rash noted.          Assessment & Plan:

## 2013-03-28 NOTE — Assessment & Plan Note (Signed)
She will have ethambutol changed to Levaquin and she is going to get it at [facility] pharmacy and is aware of the cost. She will take that with azithromycin and rifampin which she is getting as part of a drug assistance program through the [facility] health pharmacy.she will return in one month for checkup of her on this regimen.

## 2013-05-01 ENCOUNTER — Encounter: Payer: Self-pay | Admitting: Internal Medicine

## 2013-05-01 ENCOUNTER — Ambulatory Visit (INDEPENDENT_AMBULATORY_CARE_PROVIDER_SITE_OTHER): Payer: Self-pay | Admitting: Internal Medicine

## 2013-05-01 VITALS — BP 121/85 | HR 64 | Temp 98.3°F | Wt 155.0 lb

## 2013-05-01 DIAGNOSIS — A318 Other mycobacterial infections: Secondary | ICD-10-CM

## 2013-05-01 DIAGNOSIS — A310 Pulmonary mycobacterial infection: Secondary | ICD-10-CM

## 2013-05-01 NOTE — Progress Notes (Signed)
  Subjective:    Patient ID: [name], female    DOB: 11-10-1975, 37 y.o.   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 She was initially started on azithromycin, rifampin and ethambutol 3 times a week after diagnosis. She was initially being treated by the health department for presumed tuberculosis based on her history and the concern with her upper lobe lesion. It though did grow Mycobacterium avium complex. After about one month with the MAC therapy, she had some vision changes and so her ethambutol was changed to Levaquin. She however never started it and found out she was pregnant. This was despite having a IUD. She then had a spontaneous abortion. After discussion with her after the pregnancy, it did seem that most of her vision changes were actually lightheadedness and dizziness and likely related to pregnancy. She was then started again on 3 drug therapy including ethambutol. However after a few doses, she noted significant pain in her arm and, chest and back muscles that brought her to tears. She attributed it to ethambutol. She said she stopped for a while and then tried again and the same thing happened. No vision changes, no weight loss. She does continue to have some subjective dyspnea. She was then seen last month with these complaints and was going to start azithromycin, rifampin and Levaquin however she tells me she never heard back whether or not she should start. Then after clarifying above, she was again started on azithromycin rifampin however offered to get Levaquin from a different pharmacy however she lost the information the pharmacy and never called. She did then start just the 2 drug therapy and never got the third drug. She states she feels a little better and has had no significant side effects.    Review of Systems  Constitutional: Negative for fever, chills and fatigue.  Respiratory: Negative for cough and shortness of breath.    Gastrointestinal: Negative for abdominal pain.  Skin: Negative for rash.  Neurological: Negative for dizziness, light-headedness and headaches.  Hematological: Negative for adenopathy.  Psychiatric/Behavioral: Negative for dysphoric mood.       Objective:   Physical Exam  Constitutional: She appears well-developed and well-nourished. No distress.  Eyes: Right eye exhibits no discharge. Left eye exhibits no discharge. No scleral icterus.  Cardiovascular: Normal rate, regular rhythm and normal heart sounds.   No murmur heard. Pulmonary/Chest: Effort normal and breath sounds normal. No respiratory distress. She has no wheezes. She has no rales.  Lymphadenopathy:    She has no cervical adenopathy.  Neurological: She is alert.  Skin: Skin is warm and dry. No rash noted.          Assessment & Plan:

## 2013-05-01 NOTE — Assessment & Plan Note (Signed)
She was given information on where to pick up the Levaquin and will pick that up today and continue now with 3 drug therapy. She'll come back in about 4-6 weeks.

## 2013-05-08 ENCOUNTER — Telehealth: Payer: Self-pay

## 2013-05-08 NOTE — Telephone Encounter (Signed)
Patient states via interpreter she was at the pharmacy this week end for medication and there was no script there.   She does not know what to do.   Pt advised [facility] on [address] has her script. Her name is listed differently with the store but they should check by date of birth.   Pharmacist states she never picked up medication on the day the script was sent at last office visit.  Two of her three scripts will come from [facility] pharmacy so she will need to go there for the complete regimen.   Pt advised to call while at the pharmacy if there is a problem.  Pt verbalized understanding through interpreter.   Laurell Josephs, RN

## 2013-06-15 ENCOUNTER — Ambulatory Visit (INDEPENDENT_AMBULATORY_CARE_PROVIDER_SITE_OTHER): Payer: Self-pay | Admitting: Internal Medicine

## 2013-06-15 VITALS — BP 127/75 | HR 83 | Temp 97.8°F | Ht 59.0 in | Wt 156.0 lb

## 2013-06-15 DIAGNOSIS — A318 Other mycobacterial infections: Secondary | ICD-10-CM

## 2013-06-15 DIAGNOSIS — A310 Pulmonary mycobacterial infection: Secondary | ICD-10-CM

## 2013-06-15 NOTE — Progress Notes (Signed)
  Subjective:    Patient ID: [name], female    DOB: 07/14/1976, 37 y.o.   MRN: [id number]  HPI She is doing well with her 3 drug regimen with Levaquin, azithromycin and rifampin. She feels better with no further pleuritic chest pain. No abdominal pain or weight loss. No fever or chills. No night sweats.  No questions regarding therapy.   Review of Systems  Constitutional: Negative for fever and chills.  HENT: Negative for sore throat and trouble swallowing.   Eyes: Negative for visual disturbance.  Respiratory: Negative for cough and shortness of breath.   Cardiovascular: Negative for leg swelling.  Gastrointestinal: Negative for nausea and diarrhea.  Musculoskeletal: Negative for myalgias and arthralgias.  Skin: Negative for rash.  Neurological: Negative for dizziness, light-headedness and headaches.  Hematological: Negative for adenopathy.  Psychiatric/Behavioral: Negative for dysphoric mood.       Objective:   Physical Exam  Constitutional: She appears well-developed and well-nourished. No distress.  HENT:  Mouth/Throat: No oropharyngeal exudate.  Eyes: Right eye exhibits no discharge. Left eye exhibits no discharge. No scleral icterus.  Cardiovascular: Normal rate, regular rhythm and normal heart sounds.   No murmur heard. Pulmonary/Chest: Effort normal and breath sounds normal. No respiratory distress. She has no wheezes.  Lymphadenopathy:    She has no cervical adenopathy.  Skin: Skin is warm and dry. No rash noted.  Psychiatric: She has a normal mood and affect. Her behavior is normal.          Assessment & Plan:

## 2013-06-15 NOTE — Assessment & Plan Note (Addendum)
She is doing well with the therapy and I will anticipate at least a year long therapy through July of 2015. She is going to come back in 3 months and I will check her sputum at that time. 25 minutes spent with patient including 15 minutes of exam and counseling.

## 2013-06-21 IMAGING — CT CT ANGIO CHEST
1 of 2 series · 18 of 32 positions shown · IV contrast (OMNIPAQUE 300)
Comparison: Chest radiograph performed 02/01/2005

CLINICAL DATA: Left-sided chest pain, radiating down left arm;
shortness of breath.

CT ANGIOGRAPHY CHEST
TECHNIQUE: Multidetector CT imaging of the chest using the
standard protocol during bolus administration of intravenous
contrast. Multiplanar reconstructed images including MIPs were
obtained and reviewed to evaluate the vascular anatomy.
Contrast: 100mL OMNIPAQUE IOHEXOL 350 MG/ML SOLN

[Series 8: thins for pacs · axial · 0.57mm/px · z∈[-56,+168]mm · 18 of 249 slices shown]
[im 13/249  lung]
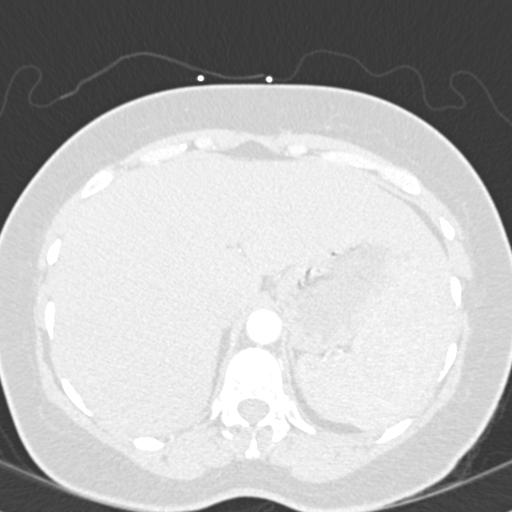
[im 25/249  mediastinal]
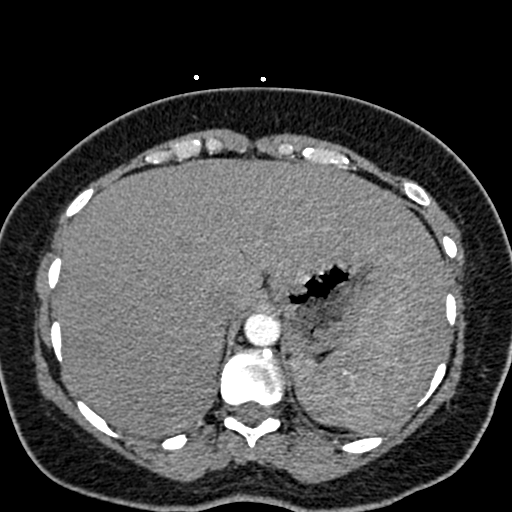
[im 50/249  lung]
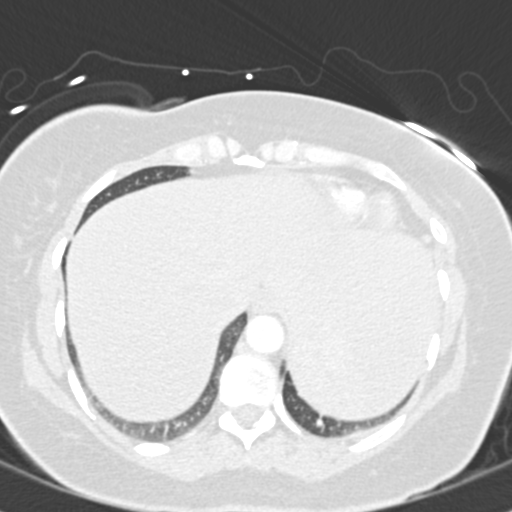
[im 63/249  mediastinal]
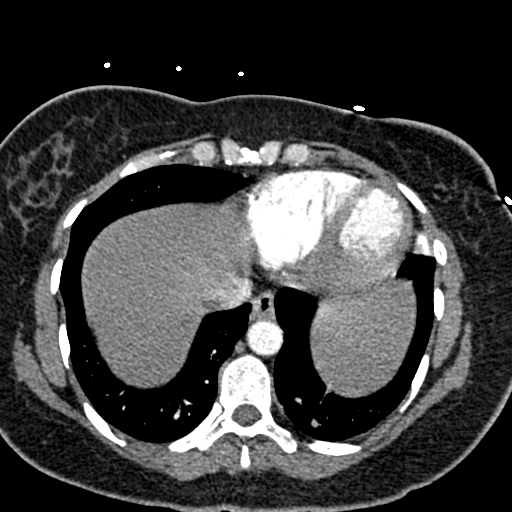
[im 75/249  lung]
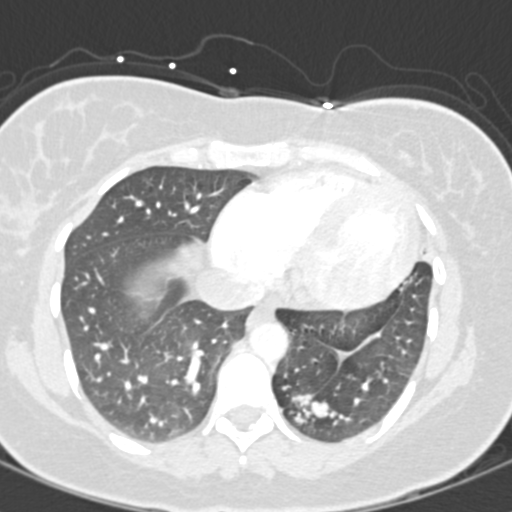
[im 83/249  mediastinal]
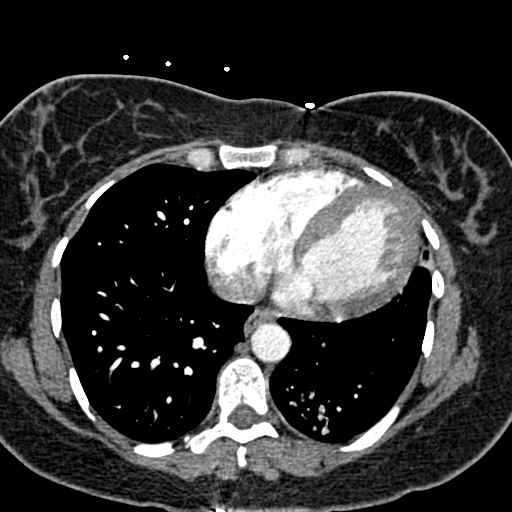
[im 87/249  lung]
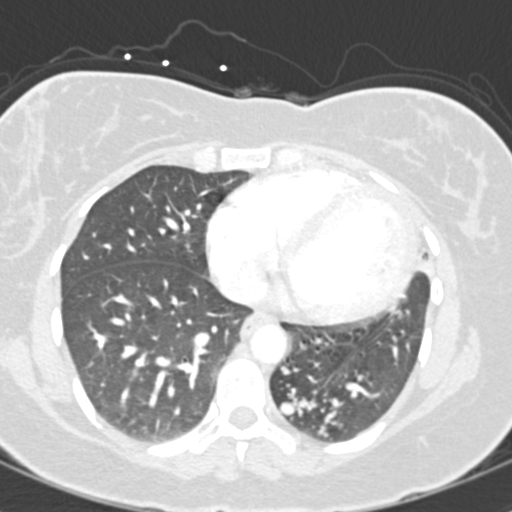
[im 112/249  mediastinal]
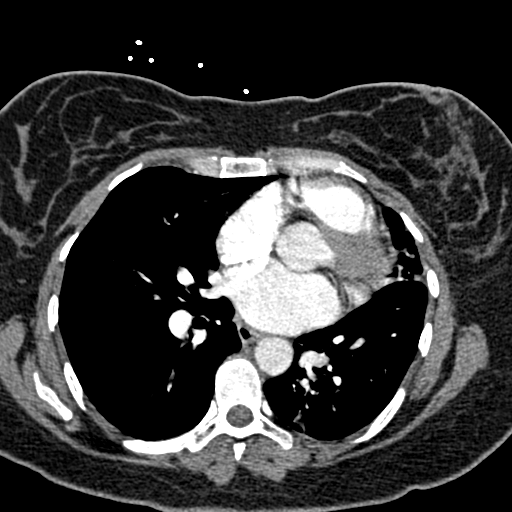
[im 115/249  lung]
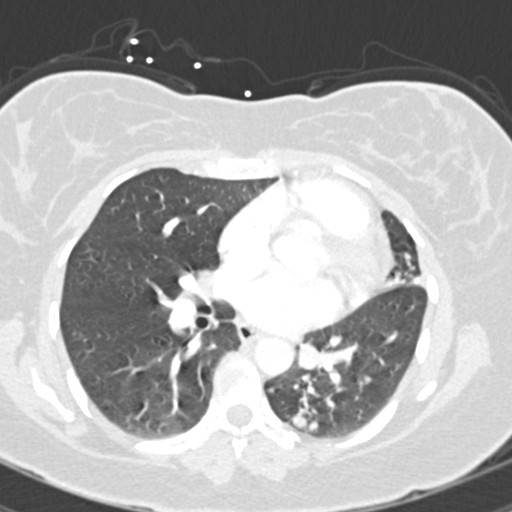
[im 125/249  mediastinal]
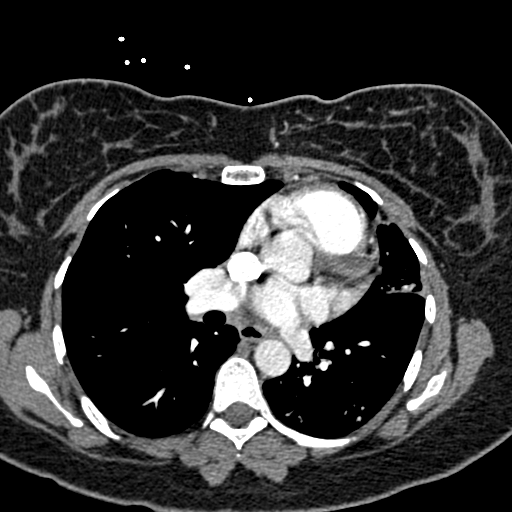
[im 137/249  lung]
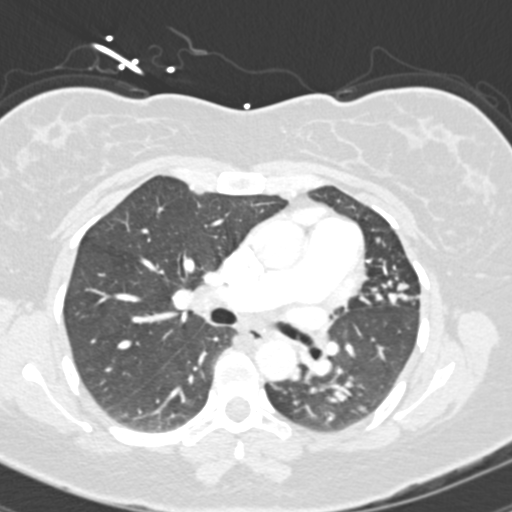
[im 162/249  mediastinal]
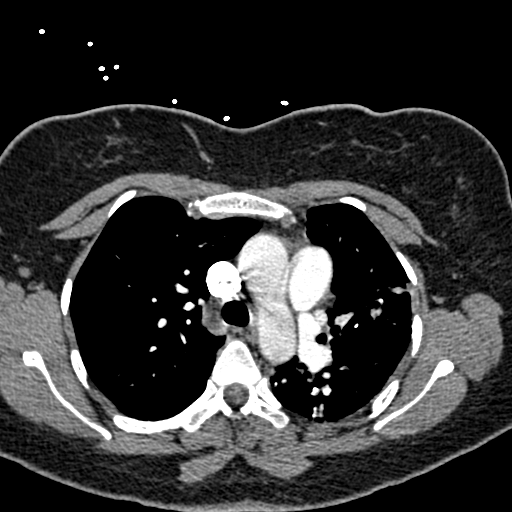
[im 166/249  lung]
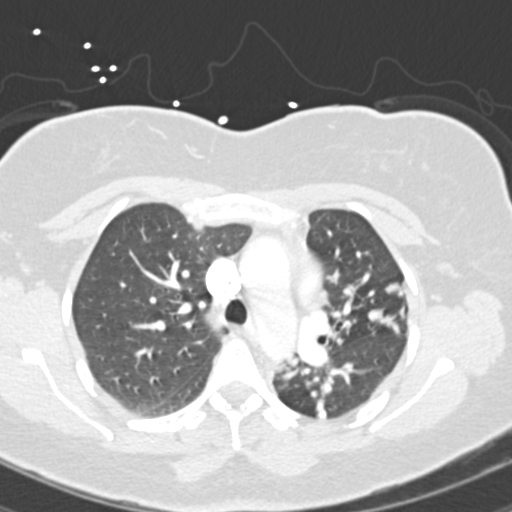
[im 174/249  mediastinal]
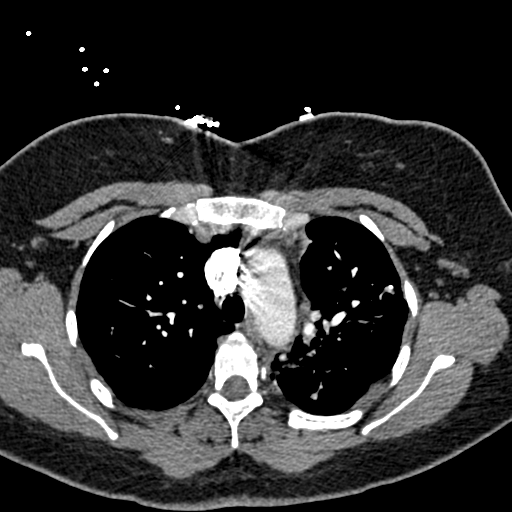
[im 187/249  lung]
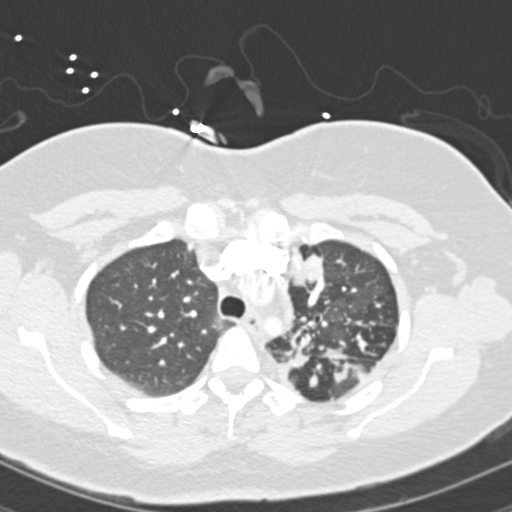
[im 199/249  mediastinal]
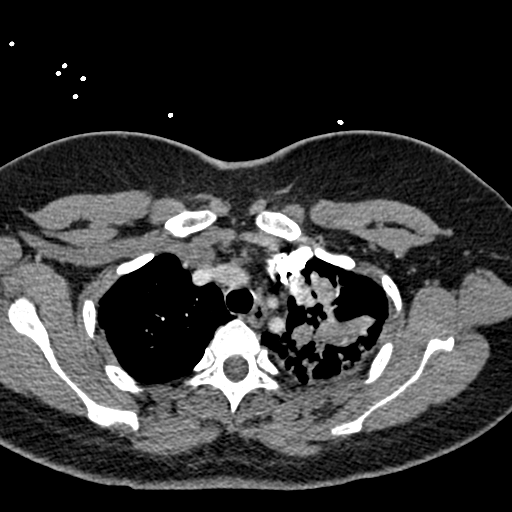
[im 224/249  lung]
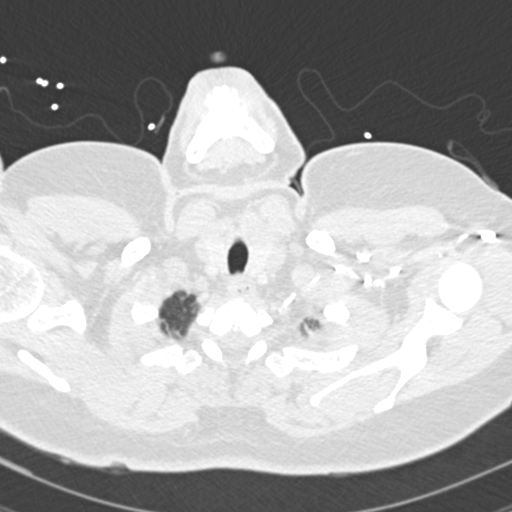
[im 236/249  mediastinal]
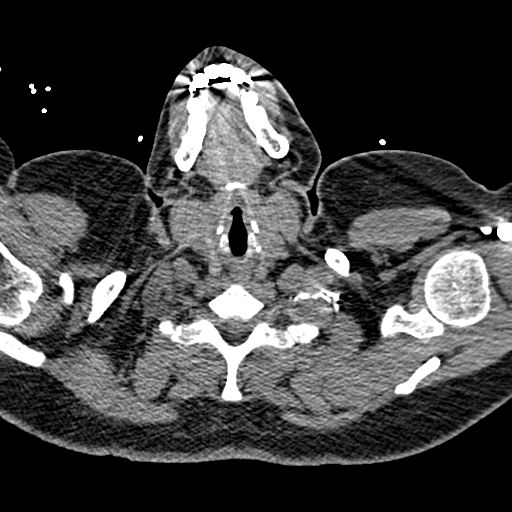

[18 of 32 positions shown; findings below may reference images not displayed]

FINDINGS: There is no evidence of significant pulmonary embolus.

There is a prominent multilobulated 4.7 x 4.1 cm mass at the left
lung apex, with associated atelectasis.  Additional nodular opacity
is noted throughout the left upper and lower lung lobes.  Scattered
calcifications within the left apical mass reflect chronic changes,
but this is highly suspicious for acute infection.  Additional
minimal nodular opacity is noted at the inferior aspect of the
right upper lobe.

Given the patient's history of tuberculosis, this is most
compatible with active tuberculosis, though a fungal infection
could have a similar appearance.  There is no evidence of
bronchiectasis to suggest aspergillosis.

There is no evidence of pleural effusion or pneumothorax.

Scattered hilar nodes are borderline normal in size; no definite
mediastinal lymphadenopathy is seen.  No pericardial effusion is
identified.  The great vessels are grossly unremarkable in
appearance.  The thyroid gland is unremarkable.  No axillary
lymphadenopathy is appreciated.

The visualized portions of the liver and spleen are unremarkable.

No acute osseous abnormalities are seen.
IMPRESSION: 1.  No evidence of significant pulmonary embolus.
2.  Prominent multilobulated 4.7 x 4.1 cm mass at the left lung
apex, with associated atelectasis.  Additional nodular opacity
throughout the left upper and lower lung lobes, with minimal
nodular opacity at the inferior aspect of the right upper lobe.
This is highly suspicious for acute infection; on comparison with
prior chest radiographs, there appears to be acute disease
superimposed on the patient's prior findings.

Given the patient's history of tuberculosis, this is most
compatible with active tuberculosis, though a fungal infection
could have a similar appearance.  No evidence of bronchiectasis to
suggest aspergillosis.

to Hrishikesh Escorza PA, who verbally acknowledged these results.

## 2013-08-01 ENCOUNTER — Telehealth: Payer: Self-pay

## 2013-08-01 NOTE — Telephone Encounter (Signed)
Pharmacy calling regarding patient's regimen.  She has not been

## 2013-08-17 ENCOUNTER — Ambulatory Visit: Payer: Self-pay

## 2013-08-17 ENCOUNTER — Ambulatory Visit (INDEPENDENT_AMBULATORY_CARE_PROVIDER_SITE_OTHER): Payer: Self-pay | Admitting: Internal Medicine

## 2013-08-17 ENCOUNTER — Encounter: Payer: Self-pay | Admitting: Internal Medicine

## 2013-08-17 VITALS — BP 118/75 | HR 79 | Temp 98.1°F | Ht 59.0 in | Wt 157.0 lb

## 2013-08-17 DIAGNOSIS — A310 Pulmonary mycobacterial infection: Secondary | ICD-10-CM

## 2013-08-17 DIAGNOSIS — A318 Other mycobacterial infections: Secondary | ICD-10-CM

## 2013-08-17 MED ORDER — AZITHROMYCIN 250 MG PO TABS: ORAL_TABLET | ORAL | Status: DC

## 2013-08-17 MED ORDER — RIFAMPIN 300 MG PO CAPS: 600.0000 mg | ORAL_CAPSULE | ORAL | Status: DC

## 2013-08-17 MED ORDER — LEVOFLOXACIN 500 MG PO TABS: 500.0000 mg | ORAL_TABLET | Freq: Every day | ORAL | Status: DC

## 2013-08-17 NOTE — Progress Notes (Signed)
  Subjective:    Patient ID: [name], female    DOB: 1976-08-25, 37 y.o.   MRN: [id number]  HPI  She is doing well with her 3 drug regimen with Levaquin, azithromycin and rifampin. She continues to remain asymptomatic now but has been out of her medications again for the last 3 weeks.  She attempted to get them but has not been able too.  No weight loss, no abdominal pain, no SOB, no cough.     Review of Systems  Constitutional: Negative for fever and chills.  HENT: Negative for sore throat and trouble swallowing.   Eyes: Negative for visual disturbance.  Respiratory: Negative for cough and shortness of breath.   Cardiovascular: Negative for leg swelling.  Gastrointestinal: Negative for nausea and diarrhea.  Musculoskeletal: Negative for arthralgias and myalgias.  Skin: Negative for rash.  Neurological: Negative for dizziness, light-headedness and headaches.  Hematological: Negative for adenopathy.  Psychiatric/Behavioral: Negative for dysphoric mood.       Objective:   Physical Exam  Constitutional: She appears well-developed and well-nourished. No distress.  HENT:  Mouth/Throat: No oropharyngeal exudate.  Eyes: Right eye exhibits no discharge. Left eye exhibits no discharge. No scleral icterus.  Cardiovascular: Normal rate, regular rhythm and normal heart sounds.   No murmur heard. Pulmonary/Chest: Effort normal and breath sounds normal. No respiratory distress. She has no wheezes.  Lymphadenopathy:    She has no cervical adenopathy.  Skin: Skin is warm and dry. No rash noted.  Psychiatric: She has a normal mood and affect. Her behavior is normal.          Assessment & Plan:

## 2013-08-17 NOTE — Assessment & Plan Note (Signed)
I discussed other options and she will get her prescriptions from [facility] starting today.  RTC in 1 month.  Will cancel follow up labs for now.

## 2013-09-06 ENCOUNTER — Other Ambulatory Visit: Payer: Self-pay

## 2013-09-08 ENCOUNTER — Inpatient Hospital Stay (HOSPITAL_COMMUNITY)
Admission: AD | Admit: 2013-09-08 | Discharge: 2013-09-08 | Disposition: A | Discharge status: Home / Self Care | Payer: Self-pay | Source: Ambulatory Visit | Attending: Obstetrics & Gynecology | Admitting: Obstetrics & Gynecology

## 2013-09-08 ENCOUNTER — Encounter (HOSPITAL_COMMUNITY): Payer: Self-pay | Admitting: Medical

## 2013-09-08 ENCOUNTER — Inpatient Hospital Stay (HOSPITAL_COMMUNITY): Payer: Self-pay

## 2013-09-08 DIAGNOSIS — M545 Low back pain, unspecified: Secondary | ICD-10-CM | POA: Insufficient documentation

## 2013-09-08 DIAGNOSIS — O4690 Antepartum hemorrhage, unspecified, unspecified trimester: Secondary | ICD-10-CM

## 2013-09-08 DIAGNOSIS — O209 Hemorrhage in early pregnancy, unspecified: Secondary | ICD-10-CM | POA: Insufficient documentation

## 2013-09-08 DIAGNOSIS — O26899 Other specified pregnancy related conditions, unspecified trimester: Secondary | ICD-10-CM

## 2013-09-08 DIAGNOSIS — R109 Unspecified abdominal pain: Secondary | ICD-10-CM | POA: Insufficient documentation

## 2013-09-08 LAB — URINALYSIS, ROUTINE W REFLEX MICROSCOPIC
Glucose, UA: NEGATIVE mg/dL
Ketones, ur: NEGATIVE mg/dL
Leukocytes, UA: NEGATIVE
Protein, ur: NEGATIVE mg/dL

## 2013-09-08 LAB — CBC
HCT: 30.2 % — ABNORMAL LOW (ref 36.0–46.0)
Platelets: 267 10*3/uL (ref 150–400)
RDW: 23.7 % — ABNORMAL HIGH (ref 11.5–15.5)
WBC: 6.7 10*3/uL (ref 4.0–10.5)

## 2013-09-08 LAB — HCG, QUANTITATIVE, PREGNANCY: hCG, Beta Chain, Quant, S: 1508 m[IU]/mL — ABNORMAL HIGH (ref <5)

## 2013-09-08 LAB — WET PREP, GENITAL: Clue Cells Wet Prep HPF POC: NONE SEEN

## 2013-09-08 LAB — URINE MICROSCOPIC-ADD ON

## 2013-09-08 NOTE — MAU Provider Note (Signed)
History     CSN: [id number]  Arrival date and time: 09/08/13 6295   First Provider Initiated Contact with Patient 09/08/13 2113      Chief Complaint  Patient presents with  . Abdominal Pain  . Back Pain   HPI Ms. [name] is a 37 y.o. [id number] at [redacted]w[redacted]d who presents to MAU today with complaint of lower abdominal and low back pain and vaginal spotting. The patient states that she noted very light bleeding yesterday and this morning, but none since then. She states that her pain feels like cramps and is rated at 4/10 now. She has not taken any pain medication. She is also having a small amount of thick, white discharge. She denies itching or irritation of the vaginal area. She has some nausea without vomiting, diarrhea or constipation. She denies fever or UTI symptoms.    OB History   Grav Para Term Preterm Abortions TAB SAB Ect Mult Living   8 6 5 1 1  1   5       Past Medical History  Diagnosis Date  . TB (pulmonary tuberculosis)     diagnosed in 2004    Past Surgical History  Procedure Laterality Date  . Cesarean section    . Facial cosmetic surgery      Family History  Problem Relation Age of Onset  . Anesthesia problems Neg Hx   . Hypotension Neg Hx   . Heart disease Mother     heart attack    History  Substance Use Topics  . Smoking status: Former Smoker    Quit date: 09/21/2007  . Smokeless tobacco: Never Used  . Alcohol Use: No    Allergies: No Known Allergies  No prescriptions prior to admission    Review of Systems  Constitutional: Negative for fever and malaise/fatigue.  Gastrointestinal: Positive for nausea and abdominal pain. Negative for vomiting, diarrhea and constipation.  Genitourinary: Negative for dysuria, urgency and frequency.       + vaginal discharge, bleeding  Musculoskeletal: Positive for back pain.  Neurological: Negative for dizziness, loss of consciousness and weakness.   Physical Exam   Blood pressure 130/60,  pulse 74, temperature 98.2 F (36.8 C), resp. rate 18, height 4\' 11"  (1.499 m), weight 161 lb 9.6 oz (73.301 kg), last menstrual period 07/19/2013, not currently breastfeeding.  Physical Exam  Constitutional: She is oriented to person, place, and time. She appears well-developed and well-nourished. No distress.  HENT:  Head: Normocephalic and atraumatic.  Cardiovascular: Normal rate, regular rhythm and normal heart sounds.   Respiratory: Effort normal and breath sounds normal. No respiratory distress.  GI: Soft. Bowel sounds are normal. She exhibits no distension. There is tenderness (mild tenderness to palpation of the suprapubic region at midline). There is no rebound and no guarding.  Genitourinary: Uterus is tender (mild). Uterus is not enlarged (limited by maternal body habitus). Cervix exhibits friability. Cervix exhibits no motion tenderness and no discharge. Right adnexum displays no mass and no tenderness. Left adnexum displays tenderness (mild). Left adnexum displays no mass. No bleeding around the vagina. Vaginal discharge (scant thin, white discharge noted) found.    Neurological: She is alert and oriented to person, place, and time.  Skin: Skin is warm and dry. No erythema.  Psychiatric: She has a normal mood and affect.   Results for orders placed during the hospital encounter of 09/08/13 (from the past 24 hour(s))  URINALYSIS, ROUTINE W REFLEX MICROSCOPIC     Status: Abnormal  Collection Time    09/08/13  8:10 PM      Result Value Range   Color, Urine YELLOW  YELLOW   APPearance CLEAR  CLEAR   Specific Gravity, Urine 1.025  1.005 - 1.030   pH 6.0  5.0 - 8.0   Glucose, UA NEGATIVE  NEGATIVE mg/dL   Hgb urine dipstick TRACE (*) NEGATIVE   Bilirubin Urine NEGATIVE  NEGATIVE   Ketones, ur NEGATIVE  NEGATIVE mg/dL   Protein, ur NEGATIVE  NEGATIVE mg/dL   Urobilinogen, UA 0.2  0.0 - 1.0 mg/dL   Nitrite NEGATIVE  NEGATIVE   Leukocytes, UA NEGATIVE  NEGATIVE  URINE  MICROSCOPIC-ADD ON     Status: None   Collection Time    09/08/13  8:10 PM      Result Value Range   Squamous Epithelial / LPF RARE  RARE   WBC, UA 0-2  <3 WBC/hpf   RBC / HPF 0-2  <3 RBC/hpf   Bacteria, UA RARE  RARE  POCT PREGNANCY, URINE     Status: Abnormal   Collection Time    09/08/13  8:53 PM      Result Value Range   Preg Test, Ur POSITIVE (*) NEGATIVE  WET PREP, GENITAL     Status: Abnormal   Collection Time    09/08/13  9:31 PM      Result Value Range   Yeast Wet Prep HPF POC NONE SEEN  NONE SEEN   Trich, Wet Prep NONE SEEN  NONE SEEN   Clue Cells Wet Prep HPF POC NONE SEEN  NONE SEEN   WBC, Wet Prep HPF POC FEW (*) NONE SEEN  CBC     Status: Abnormal   Collection Time    09/08/13  9:43 PM      Result Value Range   WBC 6.7  4.0 - 10.5 K/uL   RBC 4.34  3.87 - 5.11 MIL/uL   Hemoglobin 9.5 (*) 12.0 - 15.0 g/dL   HCT 16.1 (*) 09.6 - 04.5 %   MCV 69.6 (*) 78.0 - 100.0 fL   MCH 21.9 (*) 26.0 - 34.0 pg   MCHC 31.5  30.0 - 36.0 g/dL   RDW 40.9 (*) 81.1 - 91.4 %   Platelets 267  150 - 400 K/uL  HCG, QUANTITATIVE, PREGNANCY     Status: Abnormal   Collection Time    09/08/13  9:43 PM      Result Value Range   hCG, Beta Chain, Quant, S 1508 (*) <5 mIU/mL   US Ob Comp Less 14 Wks  09/08/2013   CLINICAL DATA:  Pregnant, lower abdomen pain  EXAM: TRANSVAGINAL OB ULTRASOUND; OBSTETRIC <14 WK ULTRASOUND  TECHNIQUE: Transvaginal ultrasound was performed for complete evaluation of the gestation as well as the maternal uterus, adnexal regions, and pelvic cul-de-sac.  COMPARISON:  January 27, 2013  FINDINGS: Intrauterine gestational sac: Visualized/normal in shape/ single.  Yolk sac:  Present  Embryo:  present  Cardiac Activity: Present  Heart Rate: 116 bpm  CRL:   0.43  mm   6 w 1 d                  [place] EDC: May 03, 2014  Maternal uterus/adnexae: Small corpus luteal cyst in the right ovary. Left ovary is normal. There is no subchorionic hemorrhage.  IMPRESSION: Single live intrauterine  gestation measuring 6 weeks and 1 day without complications.   Electronically Signed   By: Gabriel Carina.D.  On: 09/08/2013 22:26   US Ob Transvaginal  09/08/2013   CLINICAL DATA:  Pregnant, lower abdomen pain  EXAM: TRANSVAGINAL OB ULTRASOUND; OBSTETRIC <14 WK ULTRASOUND  TECHNIQUE: Transvaginal ultrasound was performed for complete evaluation of the gestation as well as the maternal uterus, adnexal regions, and pelvic cul-de-sac.  COMPARISON:  January 27, 2013  FINDINGS: Intrauterine gestational sac: Visualized/normal in shape/ single.  Yolk sac:  Present  Embryo:  present  Cardiac Activity: Present  Heart Rate: 116 bpm  CRL:   0.43  mm   6 w 1 d                  [place] EDC: May 03, 2014  Maternal uterus/adnexae: Small corpus luteal cyst in the right ovary. Left ovary is normal. There is no subchorionic hemorrhage.  IMPRESSION: Single live intrauterine gestation measuring 6 weeks and 1 day without complications.   Electronically Signed   By: Sherian Rein M.D.   On: 09/08/2013 22:26    MAU Course  Procedures None  MDM +UPT UA, Wet prep, GC/Chlamydia, CBC, quant hCG and [place] today  Assessment and Plan  A: SIUP at [redacted]w[redacted]d with cardiac activity Vaginal bleeding in pregnancy, first trimester  P: Discharge home Bleeding precautions and first trimester warning signs discussed Patient plans to start prenatal care at the [facility] Advised Tylenol PRN pain Patient may return to MAU as needed or if her condition were to change or worsen  Freddi Starr, PA-C  09/08/2013, 11:39 PM

## 2013-09-08 NOTE — MAU Note (Addendum)
I've had pain in lower back and lower abd for 2 days. Yesterday I started bleeding alittle. Pt states had SAB 7 months ago and is worried

## 2013-09-09 LAB — GC/CHLAMYDIA PROBE AMP
CT Probe RNA: NEGATIVE
GC Probe RNA: NEGATIVE

## 2013-09-09 NOTE — MAU Provider Note (Signed)
Attestation of Attending Supervision of Advanced Practitioner (CNM/NP): Evaluation and management procedures were performed by the Advanced Practitioner under my supervision and collaboration.  I have reviewed the Advanced Practitioner's note and chart, and I agree with the management and plan.  HARRAWAY-SMITH, Coreen Shippee 12:13 AM

## 2013-09-10 ENCOUNTER — Inpatient Hospital Stay (HOSPITAL_COMMUNITY)
Admission: AD | Admit: 2013-09-10 | Discharge: 2013-09-10 | Disposition: A | Discharge status: Home / Self Care | Payer: Self-pay | Source: Ambulatory Visit | Attending: Obstetrics & Gynecology | Admitting: Obstetrics & Gynecology

## 2013-09-10 ENCOUNTER — Inpatient Hospital Stay (HOSPITAL_COMMUNITY): Payer: Self-pay

## 2013-09-10 ENCOUNTER — Encounter (HOSPITAL_COMMUNITY): Payer: Self-pay | Admitting: Family

## 2013-09-10 DIAGNOSIS — O039 Complete or unspecified spontaneous abortion without complication: Secondary | ICD-10-CM

## 2013-09-10 DIAGNOSIS — O200 Threatened abortion: Secondary | ICD-10-CM | POA: Insufficient documentation

## 2013-09-10 DIAGNOSIS — O368390 Maternal care for abnormalities of the fetal heart rate or rhythm, unspecified trimester, not applicable or unspecified: Secondary | ICD-10-CM | POA: Insufficient documentation

## 2013-09-10 LAB — URINALYSIS, ROUTINE W REFLEX MICROSCOPIC
Glucose, UA: NEGATIVE mg/dL
Ketones, ur: NEGATIVE mg/dL
Protein, ur: NEGATIVE mg/dL

## 2013-09-10 LAB — URINE MICROSCOPIC-ADD ON

## 2013-09-10 NOTE — MAU Note (Signed)
Pt states via Shanda Bumps, interpreter that she was here Friday for bleeding, everything was ok. Back today for bleeding that began at 0730 this am. Is having abd cramping that feels like menstrual cramps. Has changed pad once today. Denies clots. No abnormal vaginal discharge.

## 2013-09-10 NOTE — MAU Provider Note (Signed)
History     CSN: [id number]  Arrival date and time: 09/10/13 1010   First Provider Initiated Contact with Patient 09/10/13 1055      No chief complaint on file.  HPI Syleena Bustos-Leon 37 y.o. [redacted]w[redacted]d  Client comes to MAU today with vaginal bleeding that started this morning.  She was here on Friday for evaluation of vaginal bleeding.  She has not had intercourse.  Seven months ago she had a miscarriage at 10 weeks and is very concerned she will have a miscarriage.  [facility].  OB History   Grav Para Term Preterm Abortions TAB SAB Ect Mult Living   8 6 5 1 1  1   5       Past Medical History  Diagnosis Date  . TB (pulmonary tuberculosis)     diagnosed in 2004    Past Surgical History  Procedure Laterality Date  . Cesarean section    . Facial cosmetic surgery      Family History  Problem Relation Age of Onset  . Anesthesia problems Neg Hx   . Hypotension Neg Hx   . Heart disease Mother     heart attack    History  Substance Use Topics  . Smoking status: Former Smoker    Quit date: 09/21/2007  . Smokeless tobacco: Never Used  . Alcohol Use: No    Allergies: No Known Allergies  Prescriptions prior to admission  Medication Sig Dispense Refill  . Prenatal Vit-Fe Fumarate-FA (PRENATAL MULTIVITAMIN) TABS tablet Take 1 tablet by mouth daily at 12 noon.        Review of Systems  Constitutional: Negative for fever.  Gastrointestinal: Negative for nausea, vomiting, abdominal pain, diarrhea and constipation.  Genitourinary: Negative for dysuria.       Vaginal bleeding No leaking of fluid.   Physical Exam   Blood pressure 125/59, pulse 78, temperature 97.5 F (36.4 C), temperature source Oral, resp. rate 18, height 4\' 11"  (1.499 m), weight 160 lb (72.576 kg), last menstrual period 07/19/2013.  Physical Exam  Nursing note and vitals reviewed. Constitutional: She is oriented to person,  place, and time. She appears well-developed and well-nourished. No distress.  HENT:  Head: Normocephalic.  Eyes: EOM are normal.  Neck: Neck supple.  Genitourinary:  Scant amount of pink blood on pad.  Musculoskeletal: Normal range of motion.  Neurological: She is alert and oriented to person, place, and time.  Skin: Skin is warm and dry.  Psychiatric: She has a normal mood and affect.    MAU Course  Procedures Results for orders placed during the hospital encounter of 09/10/13 (from the past 24 hour(s))  URINALYSIS, ROUTINE W REFLEX MICROSCOPIC     Status: Abnormal   Collection Time    09/10/13 10:27 AM      Result Value Range   Color, Urine YELLOW  YELLOW   APPearance CLEAR  CLEAR   Specific Gravity, Urine >1.030 (*) 1.005 - 1.030   pH 6.0  5.0 - 8.0   Glucose, UA NEGATIVE  NEGATIVE mg/dL   Hgb urine dipstick LARGE (*) NEGATIVE   Bilirubin Urine NEGATIVE  NEGATIVE   Ketones, ur NEGATIVE  NEGATIVE mg/dL   Protein, ur NEGATIVE  NEGATIVE mg/dL   Urobilinogen, UA 0.2  0.0 - 1.0 mg/dL   Nitrite NEGATIVE  NEGATIVE   Leukocytes, UA NEGATIVE  NEGATIVE  URINE MICROSCOPIC-ADD ON     Status: Abnormal  Collection Time    09/10/13 10:27 AM      Result Value Range   Squamous Epithelial / LPF FEW (*) RARE   WBC, UA 0-2  <3 WBC/hpf   RBC / HPF 21-50  <3 RBC/hpf   Bacteria, UA RARE  RARE   MDM CLINICAL DATA: Vaginal bleeding.  EXAM:  TRANSVAGINAL OB ULTRASOUND  TECHNIQUE:  Transvaginal ultrasound was performed for complete evaluation of the  gestation as well as the maternal uterus, adnexal regions, and  pelvic cul-de-sac.  COMPARISON: 11/09/2011  FINDINGS:  Intrauterine gestational sac: Visualized, mobile an  irregular-shaped. Sac is in the lower uterine segment region.  Yolk sac: Not visualized  Embryo: Visualized  Cardiac Activity: Not visualized  Heart Rate: bpm  MSD: mm w d  CRL: 4.9 mm 6 w 2d [place] EDC: 05/04/2014  Maternal uterus/adnexae: Large subchorionic  hemorrhage. Ovaries are  symmetric and unremarkable. No free fluid. Marland Kitchen  IMPRESSION:  Gestational sac is now irregular, mobile and located in the lower  uterine segment. Findings are concerning for spontaneous abortion in  progress. No fetal heart tones detected currently. Large  subchorionic hemorrhage.   [id number]  Consult with Dr. Penne Lash re: plan of care   Assessment and Plan  SAB in progress  Plan Due to significant change in ultrasound from 09-08-13, have advised client to expect a miscarriage.  Currently there is no FHT and there is now a large subchorionic hemorrhage (not present 2 days ago).   Client has had a previous miscarriage - reviewed expected bleeding. To return if bleeding is severe for more then 1-2 hours, or if client is dizzy or becoming faint. Expect the clinic to call her to schedule a follow up appointment in 2 weeks.  BURLESON,TERRI 09/10/2013, 11:12 AM

## 2013-09-11 NOTE — MAU Provider Note (Signed)
Attestation of Attending Supervision of Advanced Practitioner (CNM/NP): Evaluation and management procedures were performed by the Advanced Practitioner under my supervision and collaboration. I have reviewed the Advanced Practitioner's note and chart, and I agree with the management and plan.  LEGGETT,KELLY H. 6:23 AM   

## 2013-09-20 ENCOUNTER — Ambulatory Visit (INDEPENDENT_AMBULATORY_CARE_PROVIDER_SITE_OTHER): Payer: Self-pay | Admitting: Internal Medicine

## 2013-09-20 ENCOUNTER — Encounter: Payer: Self-pay | Admitting: Internal Medicine

## 2013-09-20 VITALS — BP 122/74 | HR 73 | Temp 97.8°F | Wt 159.0 lb

## 2013-09-20 DIAGNOSIS — A318 Other mycobacterial infections: Secondary | ICD-10-CM

## 2013-09-20 DIAGNOSIS — A310 Pulmonary mycobacterial infection: Secondary | ICD-10-CM

## 2013-09-20 NOTE — Assessment & Plan Note (Signed)
She will restart her therapy on Monday and I will see her in about one month for followup

## 2013-09-20 NOTE — Progress Notes (Signed)
  Subjective:    Patient ID: [name], female    DOB: Jul 15, 1976, 37 y.o.   MRN: [id number]  HPI  She is here for followup on her 3 drug regimen with Levaquin, azithromycin and rifampin. She continues to remain asymptomatic but since her last visit had become pregnant and had a spontaneous abortion.   No weight loss, no abdominal pain, no SOB, no cough.  She otherwise is doing well. She is getting an implant for birth control on Friday. She does have access to her medications.   Review of Systems  Constitutional: Negative for fever and chills.  HENT: Negative for sore throat and trouble swallowing.   Eyes: Negative for visual disturbance.  Respiratory: Negative for cough and shortness of breath.   Cardiovascular: Negative for leg swelling.  Gastrointestinal: Negative for nausea and diarrhea.  Musculoskeletal: Negative for arthralgias and myalgias.  Skin: Negative for rash.  Neurological: Negative for dizziness, light-headedness and headaches.  Hematological: Negative for adenopathy.  Psychiatric/Behavioral: Negative for dysphoric mood.       Objective:   Physical Exam  Constitutional: She appears well-developed and well-nourished. No distress.  HENT:  Mouth/Throat: No oropharyngeal exudate.  Eyes: Right eye exhibits no discharge. Left eye exhibits no discharge. No scleral icterus.  Cardiovascular: Normal rate, regular rhythm and normal heart sounds.   No murmur heard. Pulmonary/Chest: Effort normal and breath sounds normal. No respiratory distress. She has no wheezes.  Lymphadenopathy:    She has no cervical adenopathy.  Skin: Skin is warm and dry. No rash noted.  Psychiatric: She has a normal mood and affect. Her behavior is normal.          Assessment & Plan:

## 2013-09-28 ENCOUNTER — Encounter: Payer: Self-pay | Admitting: Medical

## 2013-10-10 ENCOUNTER — Encounter: Payer: Self-pay | Admitting: Internal Medicine

## 2013-10-10 ENCOUNTER — Ambulatory Visit (INDEPENDENT_AMBULATORY_CARE_PROVIDER_SITE_OTHER): Payer: Self-pay | Admitting: Internal Medicine

## 2013-10-10 VITALS — BP 114/75 | HR 73 | Temp 98.0°F | Wt 159.0 lb

## 2013-10-10 DIAGNOSIS — A310 Pulmonary mycobacterial infection: Secondary | ICD-10-CM

## 2013-10-10 DIAGNOSIS — A318 Other mycobacterial infections: Secondary | ICD-10-CM

## 2013-10-10 NOTE — Progress Notes (Signed)
  Subjective:    Patient ID: [name], female    DOB: 1976-06-15, 37 y.o.   MRN: [id number]  HPI  She is here for followup on her 3 drug regimen with Levaquin, azithromycin and rifampin. She has not yet picked it up.  No weight loss, no abdominal pain, no SOB, no cough.  She otherwise is doing well. She is getting an implant for birth control on Friday. She does have access to her medications.   Review of Systems  Constitutional: Negative for fever and chills.  HENT: Negative for sore throat and trouble swallowing.   Eyes: Negative for visual disturbance.  Respiratory: Negative for cough and shortness of breath.   Cardiovascular: Negative for leg swelling.  Gastrointestinal: Negative for nausea and diarrhea.  Musculoskeletal: Negative for arthralgias and myalgias.  Skin: Negative for rash.  Neurological: Negative for dizziness, light-headedness and headaches.  Hematological: Negative for adenopathy.  Psychiatric/Behavioral: Negative for dysphoric mood.       Objective:   Physical Exam  Constitutional: She appears well-developed and well-nourished. No distress.  HENT:  Mouth/Throat: No oropharyngeal exudate.  Eyes: Right eye exhibits no discharge. Left eye exhibits no discharge. No scleral icterus.  Cardiovascular: Normal rate, regular rhythm and normal heart sounds.   No murmur heard. Pulmonary/Chest: Effort normal and breath sounds normal. No respiratory distress. She has no wheezes.  Lymphadenopathy:    She has no cervical adenopathy.  Skin: Skin is warm and dry. No rash noted.  Psychiatric: She has a normal mood and affect. Her behavior is normal.          Assessment & Plan:

## 2013-10-10 NOTE — Assessment & Plan Note (Signed)
Not yet started. To pick up today.  rtc in 1 month and check labs.

## 2014-06-12 IMAGING — US US OB TRANSVAGINAL
1 series · 14 of 28 positions shown · non-contrast
Comparison: 11/09/2011

CLINICAL DATA: Vaginal bleeding.

EXAM:
TRANSVAGINAL OB ULTRASOUND
TECHNIQUE: Transvaginal ultrasound was performed for complete evaluation of the
gestation as well as the maternal uterus, adnexal regions, and
pelvic cul-de-sac.

[Series 1: us ob transvaginal · 14 of 28 slices shown]
[im 2/28]
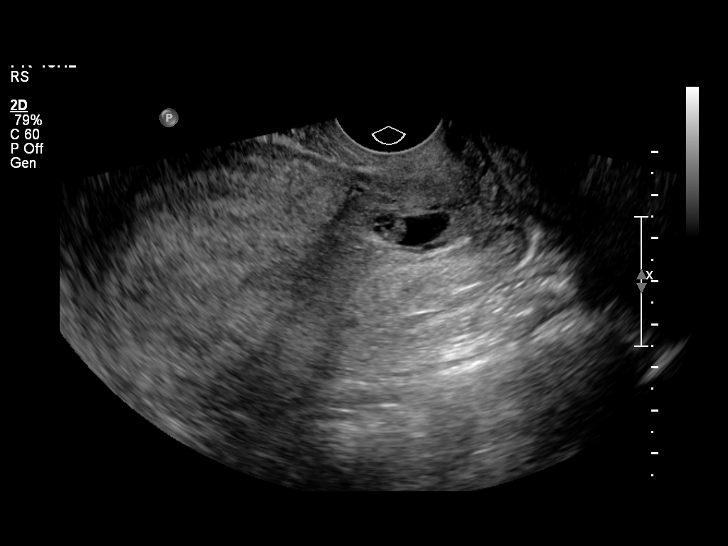
[im 4/28]
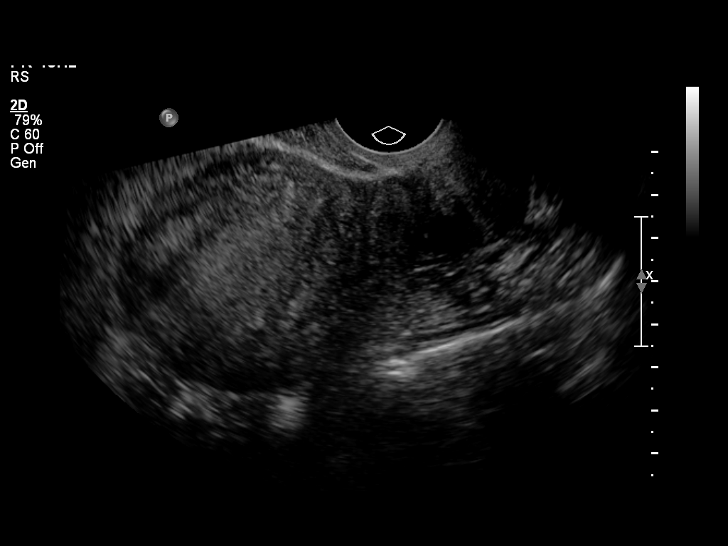
[im 6/28]
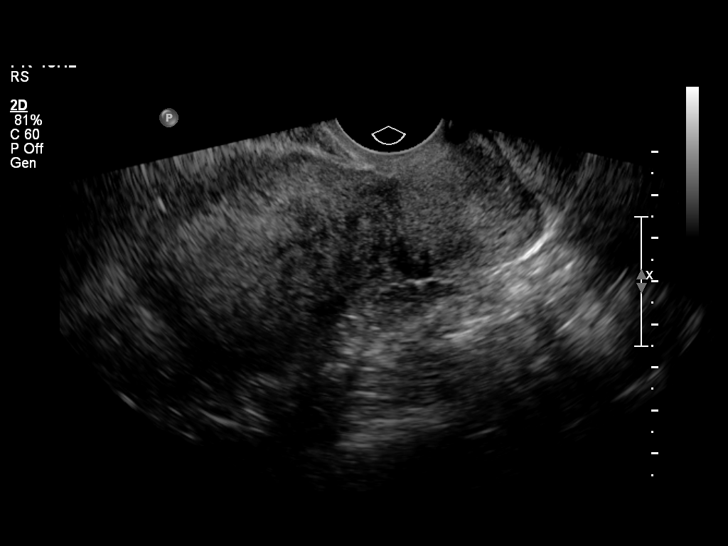
[im 8/28]
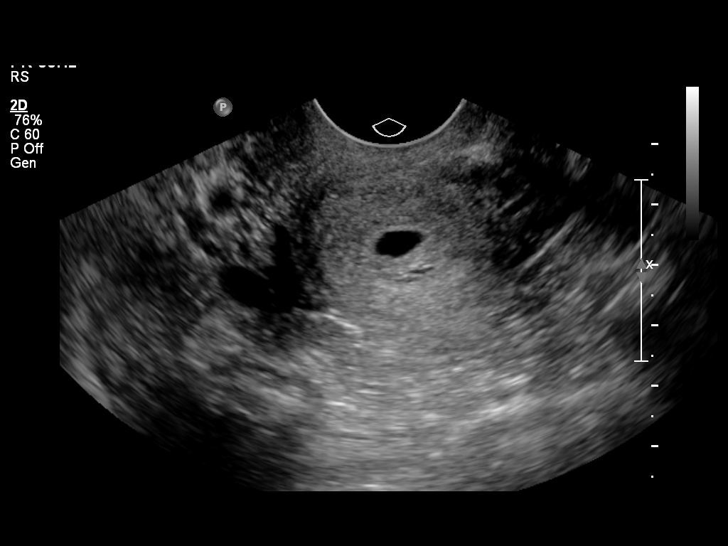
[im 10/28]
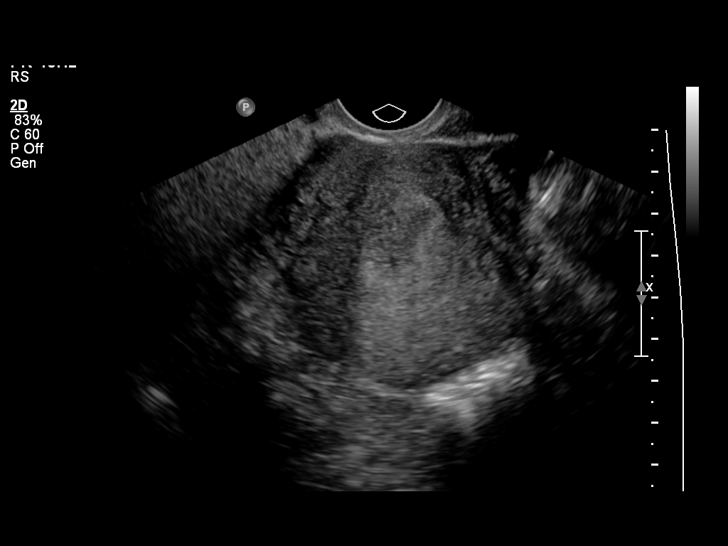
[im 12/28]
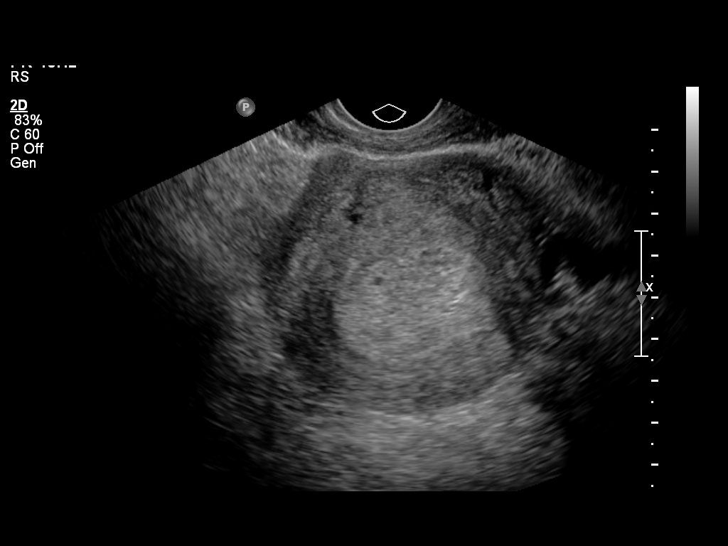
[im 14/28]
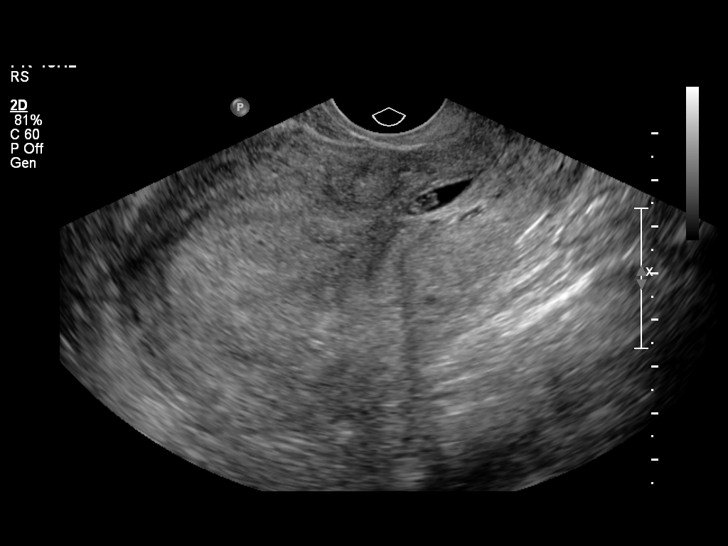
[im 16/28]
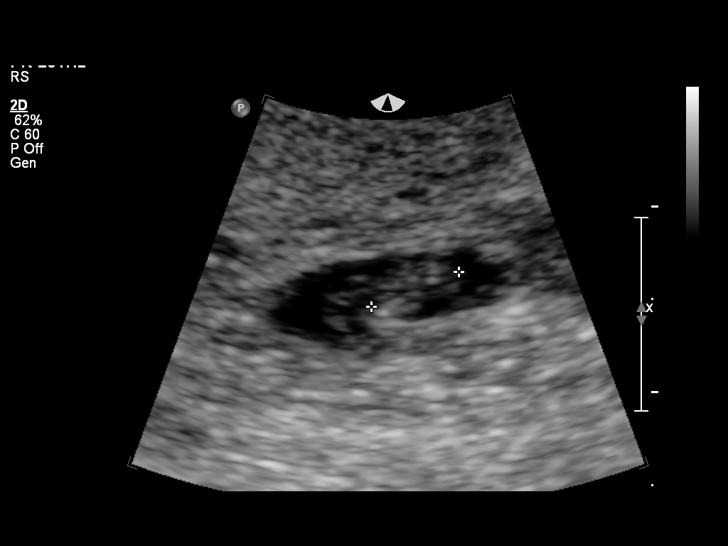
[im 18/28]
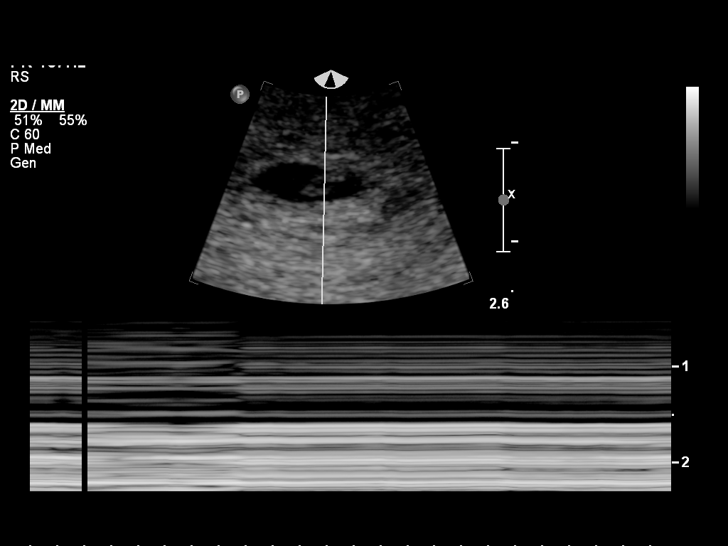
[im 20/28]
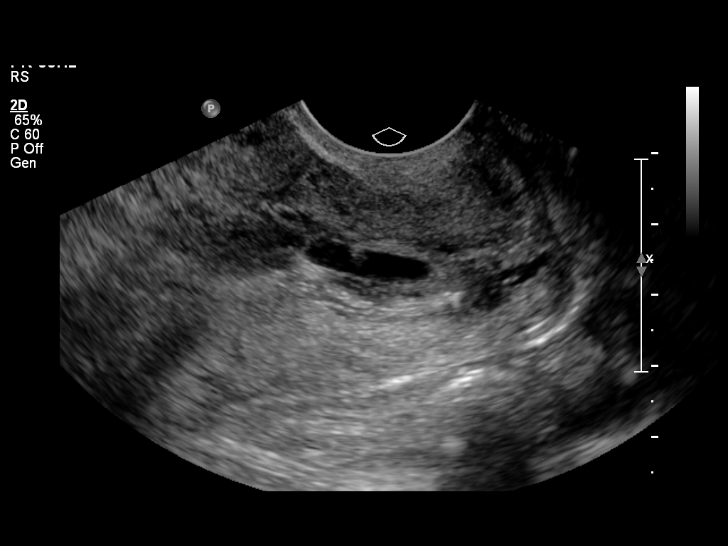
[im 22/28]
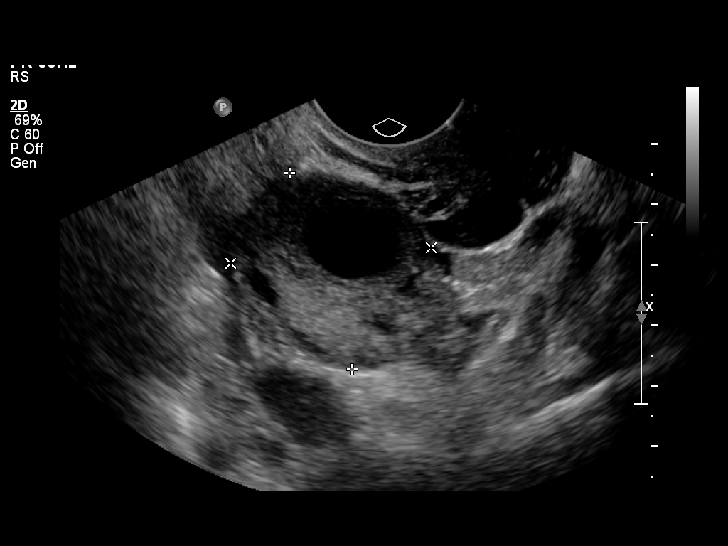
[im 24/28]
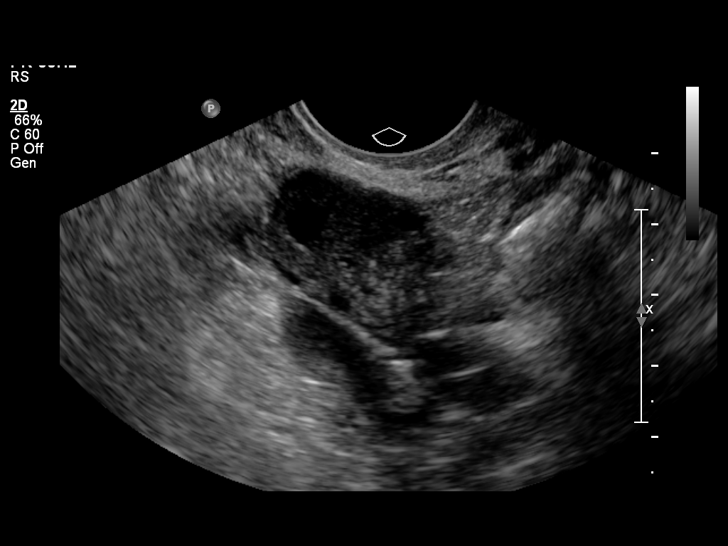
[im 26/28]
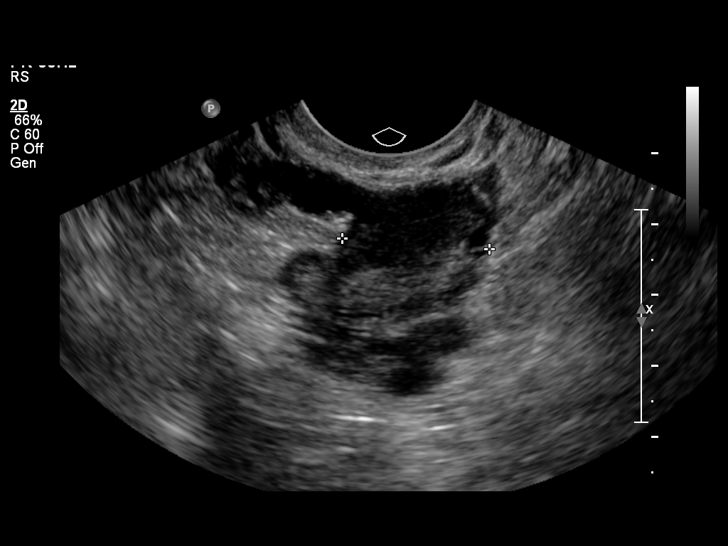
[im 28/28]
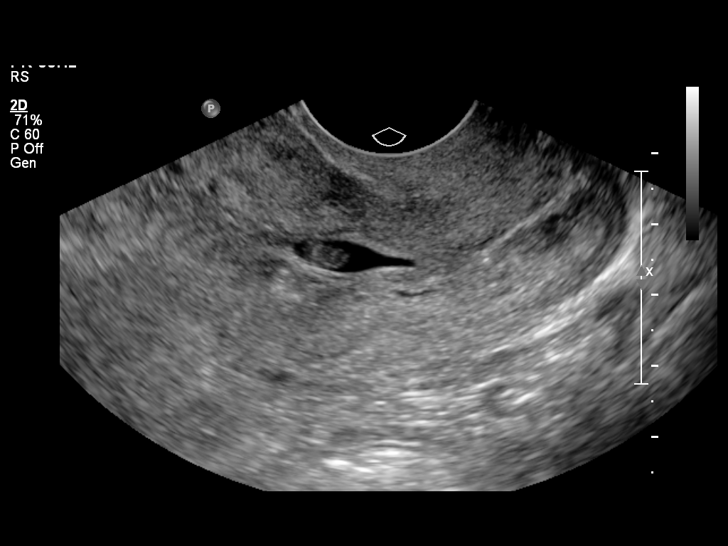

[14 of 28 positions shown; findings below may reference images not displayed]

FINDINGS: Intrauterine gestational sac: Visualized, mobile an
irregular-shaped. Sac is in the lower uterine segment region.

Yolk sac:  Not visualized

Embryo:  Visualized

Cardiac Activity: Not visualized

Heart Rate:  bpm

MSD:   mm    w     d

CRL:   4.9  mm   6 w 2d                  US EDC: 05/04/2014

Maternal uterus/adnexae: Large subchorionic hemorrhage. Ovaries are
symmetric and unremarkable. No free fluid. .
IMPRESSION: Gestational sac is now irregular, mobile and located in the lower
uterine segment. Findings are concerning for spontaneous abortion in
progress. No fetal heart tones detected currently. Large
subchorionic hemorrhage.

## 2014-08-20 ENCOUNTER — Encounter: Payer: Self-pay | Admitting: Internal Medicine

## 2015-01-03 ENCOUNTER — Telehealth: Payer: Self-pay | Admitting: *Deleted

## 2015-01-03 NOTE — Telephone Encounter (Signed)
Patient is under the care of Dr. Many[facility]Luchi at [facility]. We have received a signed release of information from the patient. Dr. Manya SilvasLuchi is looking for sensitivity lab results.  RN faxed results from [facility] received 01/16/13. Andree CossHowell, Keyairra Kolinski M, RN

## 2017-05-16 ENCOUNTER — Emergency Department: Admit: 2017-05-16 | Discharge: 2017-05-16

## 2017-05-16 ENCOUNTER — Encounter: Admit: 2017-05-16 | Discharge: 2017-05-16

## 2017-05-16 ENCOUNTER — Emergency Department: Admit: 2017-05-16 | Discharge: 2017-05-16 | Disposition: A | Discharge status: Home / Self Care

## 2017-05-16 DIAGNOSIS — A150 Tuberculosis of lung: ICD-10-CM

## 2017-05-16 DIAGNOSIS — O4690 Antepartum hemorrhage, unspecified, unspecified trimester: Principal | ICD-10-CM

## 2017-05-16 DIAGNOSIS — O209 Hemorrhage in early pregnancy, unspecified: ICD-10-CM

## 2017-05-16 DIAGNOSIS — O9989 Other specified diseases and conditions complicating pregnancy, childbirth and the puerperium: ICD-10-CM

## 2017-05-16 DIAGNOSIS — R109 Unspecified abdominal pain: ICD-10-CM

## 2017-05-16 DIAGNOSIS — R87619 Unspecified abnormal cytological findings in specimens from cervix uteri: Principal | ICD-10-CM

## 2017-05-16 LAB — CBC AND DIFF
Lab: 0 10*3/uL (ref 0–0.20)
Lab: 0.2 10*3/uL (ref 0–0.45)
Lab: 9.3 10*3/uL (ref 4.5–11.0)

## 2017-05-16 LAB — COMPREHENSIVE METABOLIC PANEL
Lab: 107 mg/dL — ABNORMAL HIGH (ref 70–100)
Lab: 134 MMOL/L — ABNORMAL LOW (ref 137–147)
Lab: 18 mg/dL — ABNORMAL LOW (ref 7–25)
Lab: 20 MMOL/L — ABNORMAL LOW (ref 21–30)
Lab: 6 U/L — ABNORMAL LOW (ref 7–56)
Lab: 9 10*3/uL — ABNORMAL HIGH (ref 3–12)
Lab: 9.4 mg/dL — ABNORMAL HIGH (ref 8.5–10.6)
Lab: >60 mL/min (ref >60)
Lab: >60 mL/min (ref >60)

## 2017-05-16 LAB — DIRECT EXAM (WET PREP)

## 2017-05-16 LAB — HEMOGLOBIN
Lab: 9.2 g/dL — ABNORMAL LOW (ref 12.0–15.0)
Lab: 8.6 g/dL — ABNORMAL LOW (ref 12.0–15.0)

## 2017-05-16 LAB — PROTIME INR (PT): Lab: 1 MMOL/L — ABNORMAL LOW (ref 0.8–1.2)

## 2017-05-16 LAB — BETA-HCG: Lab: 519 U/L — ABNORMAL HIGH (ref <5)

## 2017-05-16 LAB — PTT (APTT): Lab: 21 s — ABNORMAL LOW (ref 21.0–39.0)

## 2017-05-16 MED ORDER — ACETAMINOPHEN 325 MG PO TAB
650 mg | Freq: Once | ORAL | 0 refills | Status: CP
Start: 2017-05-16 — End: ?
  Administered 2017-05-16: 09:00:00 650 mg via ORAL

## 2017-05-16 NOTE — ED Notes
Pt to ultrasound in stable condition per tech.

## 2017-05-16 NOTE — Consults
Gynecology Consult  History and Physical Examination      Stephanie Cervantes  Admission Date: 05/16/2017                     Assessment:   41 y.o. multiparous patient consult for vaginal bleeding with viable 12 IUP. She is hemodynamically stable however during her ED course she has had a falling Hgb (10.3, 9.2, 8.6). This decline is not dilutional as she has received no IVF. Differential for this vaginal bleeding includes a bleeding cervical polyp and bleeding from her pregnancy such as with a subchorionic hematoma or from low-lying early placenta    Plan:  SSE with scant blood in vaginal vault, cervix visually closed, cervical polyp is seen which appears to be coming from anterior lip of the cervix.   SVE no CMT, non-tender uterus, cervix soft and feels minimally dilated   Ultrasound reviewed, no comment on pathology such as subchorionic hematoma.  Team 3 nurses emailed for follow up appointment and coordination of repeat viability  Patient desires to continue pregnancy  Could consider polyp removal in clinic   Bleeding cautions and symptom precautions reviewed   Patient stable and safe for discharge as bleeding has slowed. No intervention at this time    D/w Dr. Mosie Epstein, Lennie Odor, MD  PGY-2 Obstetrics and Gynecology    Please page the Gynecology pager at [phone] with any questions or concerns.  Thank you for allowing [place] to participate in the care of your patient.    ____________________________________________________________________________  Reason for Consult:  Vaginal bleeding during pregnancy    History of Present Illness:   Stephanie Cervantes is a 66 y.o. multiparous female. Presents with 2-3 days of vaginal bleeding changing her pad frequently.  Two prior pregnancies had a similar problem. She would have bleeding like this and think her baby was lost and then they weren't. After the first 3 days the bleeding would start to slow. Those pregnancies were managed at [facility] clinic. This is her first [place] of pregnancy. She had bleeding like this in 2 prior pregnancies that resulted in term deliveries. She will bleed for a few days heavy and then have spotting for up to for weeks in the first trimester. She does not recall having blood counts followed or diagnosis of anemia with those episodes. She is concerned about the viability of her pregnancy. She was happy to find out that she was pregnant. She denies crampy pain, CP, SOB, dizziness. Has changed her pad once since coming to ED.     She was seen in Dr. Nita Sells clinic last year for cervical mass which was felt to be benign cervical cyst. No Pap or biopsy was collected at that time due to appearance and recent appropriate Pap smear screening.     Review of Systems:  A comprehensive review of systems was performed and was negative, except as in HPI    OB History:   [id number], SVD x 1, CS x1, VBAC x2, SAB x1  Per patient today in ED         1st - SVD term  2nd - CS - arrest of dilation  3rd - VBAC 37  4th - VBAC 37  5th - current        Per chart review she is a [id number] however she states she has 4 living children today  OB H&P from 2015 lists OB history as follows     1997 -38 w, SVD, 2800g  2000- 35w - Cesarean - vaginal bleeding without cervical dilation   2004- 36w, PPROM, 3100 g, VBAC  2012- 38 w, VBAC  2013 37 w VBAC  2014 SAB 12       Gyn History:    LMP: may 7  Pap: 2 years ago at [facility] had normal Pap, 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  STD:  denies  Menstrual Cycles: monthly, 5 day, regular    PmHx:   Past Medical History:   Diagnosis Date   ??? Abnormal Pap smear of cervix    ??? TB (pulmonary tuberculosis) 2005    Rx in [place] about 2005.  ID notes (11/26/14; 01/03/15; 03/06/15) review records from [place] and compare CTs from 2013 in [place] and 11/27/14 at [facility] showing either slight improvement or no change. Four AFB sputupm cx were neg ([facility] and [facility] on2/9/16; 11/30/14;12/03/14;12/04/14).  1 of 4 sputum Cx in [place] in 2013-2014 were pos for MAC. On 03/06/15 ID concludes: no active TB or MAC. Entered by Manya Silvas, MD   ??? TB (pulmonary tuberculosis) 2004       PsHx:   Past Surgical History:   Procedure Laterality Date   ??? CESAREAN SECTION      2000       FmHx:   Family History   Problem Relation Age of Onset   ??? Heart Attack Mother    ??? Cancer-Breast Neg Hx    ??? Cervical Cancer Neg Hx    ??? Cancer-Colon Neg Hx    ??? Cancer-Ovarian Neg Hx    ??? Cancer-Uterine Neg Hx        Social:   Denies tobacco,  EtOH,  illicit drug use.    Married, feels safe at home.  IPV screen neg    Allergies:    Patient has no known allergies.    Medications:  No current facility-administered medications on file prior to encounter.      Current Outpatient Prescriptions on File Prior to Encounter   Medication Sig Dispense Refill   ??? metroNIDAZOLE (METROGEL-VAGINAL) 0.75 % vaginal gel 1 Applicator in vagina 1qhs 70 g 0       Physical Exam:  Vitals:    05/16/17 0245 05/16/17 0300 05/16/17 0315 05/16/17 0556   BP:    109/65   Pulse: 88 78 82 88   Temp:       SpO2: 99% 100% 99% 99%   Weight:           Gen: NAD  CV: RRR, no appreciable murmurs  Chest: Unlabored, CTAB  Abd: soft, nttp, bowel sounds present  Ext: No LE edema, nttp LE  GU: normal appearing external genitalia  SSE: minimal blood in vaginal vault, no active bleeding through cervix, visually closed, polyp seen in cervical os appears to come from anterior lip of cervix, this is non-bleeding  SVE: no CMT, no uterine tenderness, cervix soft palpates minimally dilated    Lab/Radiology/Other Diagnostic Tests:    24-hour labs:    Results for orders placed or performed during the hospital encounter of 05/16/17 (from the past 24 hour(s))   TYPE & CROSSMATCH    Collection Time: 05/16/17 12:19 AM   Result Value Ref Range    Units Ordered 0     Crossmatch Expires 05/19/2017     Record Check FOUND     ABO/RH(D) O POS     Antibody Screen NEG     Electronic Crossmatch YES  CBC AND DIFF Collection Time: 05/16/17 12:54 AM   Result Value Ref Range    White Blood Cells 9.3 4.5 - 11.0 K/UL    RBC 4.46 4.0 - 5.0 M/UL    Hemoglobin 10.3 (L) 12.0 - 15.0 GM/DL    Hematocrit 16.1 (L) 36 - 45 %    MCV 70.0 (L) 80 - 100 FL    MCH 23.0 (L) 26 - 34 PG    MCHC 32.9 32.0 - 36.0 G/DL    RDW 09.6 (H) 11 - 15 %    Platelet Count 229 150 - 400 K/UL    MPV 8.7 7 - 11 FL    Neutrophils 78 (H) 41 - 77 %    Lymphocytes 13 (L) 24 - 44 %    Monocytes 7 4 - 12 %    Eosinophils 2 0 - 5 %    Basophils 0 0 - 2 %    Absolute Neutrophil Count 7.20 (H) 1.8 - 7.0 K/UL    Absolute Lymph Count 1.20 1.0 - 4.8 K/UL    Absolute Monocyte Count 0.70 0 - 0.80 K/UL    Absolute Eosinophil Count 0.20 0 - 0.45 K/UL    Absolute Basophil Count 0.00 0 - 0.20 K/UL   COMPREHENSIVE METABOLIC PANEL    Collection Time: 05/16/17 12:54 AM   Result Value Ref Range    Sodium 134 (L) 137 - 147 MMOL/L    Potassium 3.6 3.5 - 5.1 MMOL/L    Chloride 105 98 - 110 MMOL/L    Glucose 107 (H) 70 - 100 MG/DL    Blood Urea Nitrogen 18 7 - 25 MG/DL    Creatinine 0.45 0.4 - 1.00 MG/DL    Calcium 9.4 8.5 - 40.9 MG/DL    Total Protein 8.1 (H) 6.0 - 8.0 G/DL    Total Bilirubin 0.2 (L) 0.3 - 1.2 MG/DL    Albumin 4.2 3.5 - 5.0 G/DL    Alk Phosphatase 50 25 - 110 U/L    AST (SGOT) 26 7 - 40 U/L    CO2 20 (L) 21 - 30 MMOL/L    ALT (SGPT) 6 (L) 7 - 56 U/L    Anion Gap 9 3 - 12    eGFR Non African American >60 >60 mL/min    eGFR African American >60 >60 mL/min   PROTIME INR (PT)    Collection Time: 05/16/17 12:54 AM   Result Value Ref Range    INR 1.0 0.8 - 1.2   PTT (APTT)    Collection Time: 05/16/17 12:54 AM   Result Value Ref Range    APTT 21.8 21.0 - 39.0 SEC   BETA-HCG    Collection Time: 05/16/17 12:54 AM   Result Value Ref Range    Beta-HCG,Serum 51,915 (H) <5 U/L   DIRECT EXAM (WET PREP)    Collection Time: 05/16/17  2:01 AM   Result Value Ref Range    Battery Name DIRECT EXAM,WET PREP     Specimen Description VAGINAL     Special Requests NONE     Direct Exam MANY  NEUTROPHILS  NO YEAST SEEN  NO CLUE CELLS SEEN  NO TRICHOMONAS SEEN      Report Status FINAL  05/16/2017      HEMOGLOBIN    Collection Time: 05/16/17  4:12 AM   Result Value Ref Range    Hemoglobin 9.2 (L) 12.0 - 15.0 GM/DL   HEMOGLOBIN    Collection Time: 05/16/17  6:04 AM  Result Value Ref Range    Hemoglobin 8.6 (L) 12.0 - 15.0 GM/DL       Point of Care Testing:  Glucose: (!) 107 (05/16/17 0054)

## 2017-05-16 NOTE — ED Notes
Pt sleeping with equal and non labored respirations; did not disturb.

## 2017-05-16 NOTE — ED Notes
Pt ambulates to the bathroom with specimen cup and urine sample requested. Pt passed a large blood clot that doesn't appear to have tissue present during her void and un able to provide urine sample. Blood clot was saved in case needed for specimen and Dr. Jimmey RalphParker visualized it.

## 2017-05-16 NOTE — ED Notes
Pt updated on results and plan of care with spanish interpreter and verbalized understanding. Will continue to monitor.

## 2017-05-16 NOTE — ED Notes
[facility] room [facility] with [facility] interpreter for the evaluation of bleeding with pregnancy. Pt has heavy bleeding that soaks through her under ware and shorts en route; and soaked the WC en route from triage. Pt assisted in to gown, placed on continuous cardiac, BP, and SpO2 monitors. Pt states that this her 5th pregnancy, 4 living children, and one spontaneous AB. Also reports that with three of her living children she had spontaneous and heavy bleeding during pregnancy. Warm blankets given and call light in reach.       BELONGINGS:  Carney BernJean shorts  White T-shirt  White tank top  Barnes & NobleWhite shoes x2  Underwear  Cell phone  Pt DENIES all other valuables

## 2017-05-16 NOTE — ED Notes
Dr Jimmey RalphParker at bedside with Spanish interpreter for MD evaluation.

## 2017-05-16 NOTE — ED Notes
Pt returns from ultrasound in stable condition. Will continue to monitor.

## 2017-05-16 NOTE — ED Notes
Pt wakes to verbal stimuli and updated on plan of care with verbalization of understanding. Will continue to monitor.

## 2017-05-17 LAB — CHLAM/NG PCR SWAB
Lab: NEGATIVE U/L — ABNORMAL LOW (ref 25–110)
Lab: NEGATIVE g/dL — ABNORMAL HIGH (ref 3.5–5.0)

## 2017-05-21 ENCOUNTER — Ambulatory Visit: Admit: 2017-05-21 | Discharge: 2017-05-21

## 2017-05-21 ENCOUNTER — Ambulatory Visit: Admit: 2017-05-21 | Discharge: 2017-05-22

## 2017-05-21 ENCOUNTER — Encounter: Admit: 2017-05-21 | Discharge: 2017-05-22

## 2017-05-21 ENCOUNTER — Encounter: Admit: 2017-05-21 | Discharge: 2017-05-21

## 2017-05-21 DIAGNOSIS — M6289 Other specified disorders of muscle: Principal | ICD-10-CM

## 2017-05-21 DIAGNOSIS — Z3A12 12 weeks gestation of pregnancy: Principal | ICD-10-CM

## 2017-05-21 DIAGNOSIS — Z315 Encounter for genetic counseling: ICD-10-CM

## 2017-05-21 DIAGNOSIS — Z3682 Encounter for antenatal screening for nuchal translucency: ICD-10-CM

## 2017-05-21 DIAGNOSIS — R87619 Unspecified abnormal cytological findings in specimens from cervix uteri: Principal | ICD-10-CM

## 2017-05-21 DIAGNOSIS — Z3481 Encounter for supervision of other normal pregnancy, first trimester: ICD-10-CM

## 2017-05-21 DIAGNOSIS — O09521 Supervision of elderly multigravida, first trimester: Principal | ICD-10-CM

## 2017-05-21 DIAGNOSIS — A150 Tuberculosis of lung: ICD-10-CM

## 2017-05-21 DIAGNOSIS — N841 Polyp of cervix uteri: ICD-10-CM

## 2017-05-21 LAB — HIV 1& 2 AG-AB SCRN W REFLEX HIV 1 PCR QUANT: Lab: NEGATIVE g/dL — ABNORMAL LOW (ref 32.0–36.0)

## 2017-05-21 LAB — CBC
Lab: 23 pg — ABNORMAL LOW (ref 26–34)
Lab: 29 % — ABNORMAL HIGH (ref 11–15)
Lab: 30 % — ABNORMAL LOW (ref 36–45)
Lab: 4.2 M/UL (ref 4.0–5.0)
Lab: 7.4 10*3/uL (ref 4.5–11.0)
Lab: 8.9 FL (ref 7–11)

## 2017-05-21 LAB — HEPATITIS B SURFACE AG: Lab: NEGATIVE FL — ABNORMAL LOW (ref 80–100)

## 2017-05-21 LAB — HEMOGLOBIN A1C: Lab: 5 % — ABNORMAL LOW (ref 4.0–6.0)

## 2017-05-21 LAB — RUBELLA AB IGG

## 2017-05-21 LAB — SYPHILIS AB SCREEN: Lab: NEGATIVE

## 2017-05-21 NOTE — Progress Notes
1. Have you or your sexual partner traveled away from [place] in the last 12 weeks? No

## 2017-05-21 NOTE — Progress Notes
[name] was seen at the time of her first trimester ultrasound by Debby Bud, MS, CGC for Genetic Counseling for greater than 30 minutes.  Stephanie Cervantes is a 41 year old G37P6, Hispanic female referred to [place] for maternal age.   We discussed with Stephanie Cervantes the risk of having a child with a chromosome abnormality increases with advancing maternal age.  Specifically, at age 47 at delivery there is a 1 in 31 risk for a chromosome abnormality to occur.    We reviewed the results of ultrasound examination today which includes sonographic measurements of the nuchal translucency, nasal bone, ductus venosus and tricuspid valve waveforms.      The nuchal translucency was 1.8 mm.  The nasal bone is present and the tricuspid valve and ductus venosus waveforms appear normal.           Trisomy 21 Trisomy 18 Trisomy 13    Risk associated with maternal age 28 out of 58 1 out of 185 1 out of 579   Risk adjusted based on first trimester ultrasound 1 out of 1494 1 out of 3699 1 out of 11,589       In addition to ultrasound maternal blood sampling may be utilized to increase the accuracy of this testing.  Biochemical marker screening which assess the levels of ???hCG, PAPP-A and AFP which are produced by the placenta in the first trimester may add approximately 10 percent to the detection rate for these conditions.  In addition, because they are placental in origin, it may aid in the detection of women who are at increased risk for pregnancy complications such as IUGR and preeclampsia.    Another option for fetal aneuploidy analysis is through the analysis of cell-free fetal DNA which is found in maternal serum.  Cells which originate from fetal tissues are found in maternal serum and DNA from these cells may be analyzed through massively parallel sequencing.  Currently, only aneuploidy for chromosomes 13, 18, 21, X and Y and select microdeletions is routinely reported.   Although the testing is highly sensitive and specific, it is considered a screening test and positive results should be followed by diagnostic testing such as amniocentesis.    Invasive testing such as Chorionic Villus Sampling and amniocentesis were also discussed.  Chorionic Villus Sampling is performed before 14 weeks of gestation.  It involves removing a small sample of the developing placenta which generally has the same chromosomal constitution as the fetus.      Amniocentesis, which is performed at greater than 15 weeks,  requires a sample of amniotic fluid which contains cells of fetal origin.   Cells collected by either CVS or amniocentesis are grown in culture for analysis of the fetal chromosomes.    The risks, benefits and limitations of genetic amniocentesis and diagnostic ultrasound were discussed in detail.   Genetic amniocentesis carries the risk of miscarriage of approximately 1 in 500, and CVS has a risk of 1 in 200 primarily due to infection, bleeding or leakage of amniotic fluid.  Karyotype analysis could accurately predict whether or not the fetus is affected with Down syndrome, or another chromosome abnormality, in 99.6% of cases.  In addition, cultured cells may be used for other testing such as microarray analysis.    As part of routine screening, the patient was offered an expanded genetic carrier screening panel which includes Cystic Fibrosis, Fragile X and Spinal Muscular Atrophy.  These disorders are common in the general population and individuals are often  unaware that they are carriers for these conditions.  At this time the patient declines screening, but is aware that testing remains available in the future.    After a discussion of the risks, benefits, limitations and alternatives of the various types of testing, [name] declined further testing for aneuploidy at this time.  She desires to discuss the testing with her husband.  The cost of the testing was discussed with the patient.  Spanish interpretation provided by [facility]o.

## 2017-05-22 DIAGNOSIS — N841 Polyp of cervix uteri: Principal | ICD-10-CM

## 2017-05-24 NOTE — Progress Notes
Decidualized endometrial polyp removed.  No products of conception.  Benign pathology.  Please call to inform.

## 2017-05-25 NOTE — Progress Notes
Janki Bustos presents for an ultrasound encounter. Past Medical, Surgical, Family & Social History; Medications & Allergies contained in the electronic record below were not reviewed today and may not be up-to-date. Please see A/S OBGYN report for all documentation related to this encounter.    05/25/2017  Rocky CraftsVanessa Davonna Ertl, LPN

## 2017-06-01 ENCOUNTER — Encounter: Admit: 2017-06-01 | Discharge: 2017-06-01

## 2017-06-01 DIAGNOSIS — D649 Anemia, unspecified: Principal | ICD-10-CM

## 2017-06-25 ENCOUNTER — Ambulatory Visit: Admit: 2017-06-25 | Discharge: 2017-06-25

## 2017-06-25 ENCOUNTER — Encounter: Admit: 2017-06-25 | Discharge: 2017-06-25

## 2017-06-25 DIAGNOSIS — D649 Anemia, unspecified: Principal | ICD-10-CM

## 2017-06-25 DIAGNOSIS — Z3482 Encounter for supervision of other normal pregnancy, second trimester: Principal | ICD-10-CM

## 2017-06-25 DIAGNOSIS — Z3A12 12 weeks gestation of pregnancy: ICD-10-CM

## 2017-06-25 LAB — URINALYSIS, MICROSCOPIC

## 2017-06-25 LAB — IRON + BINDING CAPACITY + %SAT+ FERRITIN
Lab: 44 ug/dL — ABNORMAL LOW (ref 50–160)
Lab: 639 ug/dL — ABNORMAL HIGH (ref 270–380)

## 2017-06-25 LAB — URINALYSIS DIPSTICK
Lab: NEGATIVE 10*3/uL (ref 0–0.45)
Lab: NEGATIVE MMOL/L (ref 21–30)
Lab: NEGATIVE U/L (ref 7–40)
Lab: NEGATIVE mL/min — ABNORMAL LOW (ref 0–0.80)
Lab: NEGATIVE mL/min — ABNORMAL LOW (ref 1.0–4.8)

## 2017-06-25 NOTE — Progress Notes
Pt is a 41 y.o. [id number] at 73w3dby L=12, Estimated Date of Delivery: 11/29/17     Pt has no concerns today. She feels much better than last visit. No bleeding. She is taking iron daily. Will get electrophoresis today.     -LOF/-VB/-CTX, +FM  See ACOG for today's vitals/physical exam  FHT: 1454       Complicating      Factors  AMA: Will begin BPPs at 32 wga   Anemia: Hemoglobin electrophoresis today, recommended iron. She is taking.   Cervical polyps with vaginal bleeding this pregnancy-polyps removed at 122wga. 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   H/o TB-cured per ID noted 02/2015, see complete history regarding her TB in her problem list   H/o PTD d/t PPROM at 376wga   H/o C/S d/t failure to progress, 4 VBACs     Prenatal labs O pos, Ab neg, RI, RPR - HBs neg, HIV neg, Gc/CT -/-. Urine culture next visit.     Pap smear Co testing negative 05/2017     NT sono Low risk     1st tri serum screen Declines     Expand gene screen Declines     AFP/Quad screen Recommended today.            Anatomy sono Ordered     28wk labs      GTT      GBS          Flu vaccine OOS     Tdap vaccine      Rhogam vaccine Not indicated     MMR vaccine Not indicated     Contraception      Breast/Bottle feeding      Notes          Plan  Contine routine prenatal care and PNV  20 week anatomy [place]. Hemoglobin electrophoresis and AFP ordered. Urine culture next visit.  Baby friendly classes previously offered to pt, she may schedule at check out if desired.  Tours of L&D previously offered.  RTC 3 weeks with Dr DWynonia Musty    D/w Dr RReece Levy JMarcello Fennel MD

## 2017-06-25 NOTE — Progress Notes
1. Have you or your sexual partner traveled away from [place] in the last 12 weeks? No

## 2017-06-26 LAB — CULTURE-URINE W/SENSITIVITY

## 2017-06-27 NOTE — Progress Notes
I have reviewed the case with Dr. Herzberg at the time of the visit and agree with the evaluation and plan as documented by the resident.

## 2017-06-28 LAB — MATERNAL SERUM AFP QUEST
Lab: 34 ng/mL
Lab: 0.8
Lab: <1

## 2017-06-28 LAB — ELECTROPHORESIS-HEMOGLOBIN
Lab: 2.3 % (ref 1.5–3.5)
Lab: 97 % (ref 94.5–98.5)

## 2017-07-16 ENCOUNTER — Ambulatory Visit: Admit: 2017-07-16 | Discharge: 2017-07-16

## 2017-07-16 ENCOUNTER — Encounter: Admit: 2017-07-16 | Discharge: 2017-07-16

## 2017-07-16 DIAGNOSIS — R87619 Unspecified abnormal cytological findings in specimens from cervix uteri: Principal | ICD-10-CM

## 2017-07-16 DIAGNOSIS — O09A2 Supervision of pregnancy with history of molar pregnancy, second trimester: Principal | ICD-10-CM

## 2017-07-16 DIAGNOSIS — A150 Tuberculosis of lung: ICD-10-CM

## 2017-07-16 DIAGNOSIS — Z3482 Encounter for supervision of other normal pregnancy, second trimester: Principal | ICD-10-CM

## 2017-07-16 NOTE — Progress Notes
1. Have you or your sexual partner traveled away from [place] in the last 12 weeks? No

## 2017-07-21 NOTE — Progress Notes
Stephanie Cervantes presents for an ultrasound encounter. Past Medical, Surgical, Family & Social History; Medications & Allergies contained in the electronic record below were not reviewed today and may not be up-to-date. Please see A/S OBGYN report for all documentation related to this encounter.    07/21/2017  Rocky Crafts, LPN

## 2017-07-22 ENCOUNTER — Encounter: Admit: 2017-07-22 | Discharge: 2017-07-22

## 2017-07-22 ENCOUNTER — Inpatient Hospital Stay: Admit: 2017-07-22 | Discharge: 2017-07-22

## 2017-07-22 ENCOUNTER — Inpatient Hospital Stay
Admit: 2017-07-22 | Discharge: 2017-07-25 | Disposition: A | Discharge status: Home / Self Care | Attending: Maternal & Fetal Medicine | Admitting: Maternal & Fetal Medicine

## 2017-07-22 DIAGNOSIS — A150 Tuberculosis of lung: ICD-10-CM

## 2017-07-22 DIAGNOSIS — R87619 Unspecified abnormal cytological findings in specimens from cervix uteri: Principal | ICD-10-CM

## 2017-07-22 DIAGNOSIS — O4412 Placenta previa with hemorrhage, second trimester: ICD-10-CM

## 2017-07-22 LAB — FIBRINOGEN: Lab: 434 mg/dL — ABNORMAL HIGH (ref 200–400)

## 2017-07-22 LAB — KLEIHAUER-BETKE FETAL HGB ST

## 2017-07-22 LAB — PROTIME INR (PT): Lab: 1 g/dL — ABNORMAL LOW (ref 0.8–1.2)

## 2017-07-22 LAB — CBC
Lab: 17 % — ABNORMAL HIGH (ref 11–15)
Lab: 178 10*3/uL (ref 150–400)
Lab: 34 % — ABNORMAL LOW (ref 36–45)
Lab: 4.1 M/UL (ref 4.0–5.0)
Lab: 6.9 10*3/uL (ref 4.5–11.0)
Lab: 8.3 FL (ref 7–11)
Lab: 82 FL (ref 80–100)

## 2017-07-22 LAB — PTT (APTT): Lab: 28 s (ref 20.0–36.0)

## 2017-07-22 MED ORDER — LACTATED RINGERS IV SOLP
INTRAVENOUS | 0 refills | Status: DC
Start: 2017-07-22 — End: 2017-07-23
  Administered 2017-07-22 – 2017-07-23 (×4): 1000.000 mL via INTRAVENOUS

## 2017-07-22 MED ORDER — PRENATAL VIT-IRON FUM-FOLIC AC 28 MG IRON- 800 MCG PO TAB
1 | Freq: Every day | ORAL | 0 refills | Status: DC
Start: 2017-07-22 — End: 2017-07-25
  Administered 2017-07-22 – 2017-07-25 (×3): 1 via ORAL

## 2017-07-22 MED ORDER — ACETAMINOPHEN 500 MG PO TAB
1000 mg | Freq: Once | ORAL | 0 refills | Status: CP
Start: 2017-07-22 — End: ?
  Administered 2017-07-22: 20:00:00 1000 mg via ORAL

## 2017-07-22 MED ORDER — FLU VACC QS2018-19 6MOS UP(PF) 60 MCG (15 MCG X 4)/0.5 ML IM SYRG
.5 mL | Freq: Once | INTRAMUSCULAR | 0 refills | Status: DC
Start: 2017-07-22 — End: 2017-07-25

## 2017-07-22 NOTE — Anesthesia Pre-Procedure Evaluation
Anesthesia Pre-Procedure Evaluation    Name: Stephanie Cervantes      MRN: [id number]     DOB: Mar 28, 1976     Age: 41 y.o.     Sex: female   __________________________________________________________________________     Procedure Date: 07/22/2017   Procedure: * No procedures listed *     Physical Assessment  Vital Signs (last filed in past 24 hours):  BP: 117/59 (10/04 0402)  Pulse: 75 (10/04 0402)  SpO2: 97 % (10/04 0310)  Height: 149.9 cm (59.02) (10/04 0327)  Weight: 70.8 kg (156 lb) (10/04 0327)      Patient History  No Known Allergies     Current Medications    Medication Directions   vitamins, prenatal w/iron & folate 65/1 mg tab Take 1 tablet by mouth daily.         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        Pulmonary           Hx of TB in 2005 in [place]--has been cleared by ID      Cardiovascular - negative        GI/Hepatic/Renal - negative        Neuro/Psych - negative        Musculoskeletal - negative        Endocrine/Other         Pregnant; GA:[redacted]w[redacted]d, AV:[id number]          Prior C-section (has successfully VBAC x4 )                     Physical Exam    Airway Findings      Mallampati: II      TM distance: >3 FB      Neck ROM: full      Mouth opening: good    Cardiovascular Findings: Negative      Pulmonary Findings: Negative      Abdominal Findings: Negative        Comments: gfravid uterus    Neurological Findings: Negative         Diagnostic Tests  Hematology:   Lab Results   Component Value Date    HGB 11.4 07/22/2017    HCT 34.2 07/22/2017    PLTCT 178 07/22/2017    WBC 6.9 07/22/2017    NEUT 78 05/16/2017    ANC 7.20 05/16/2017    ALC 1.20 05/16/2017    MONA 7 05/16/2017    AMC 0.70 05/16/2017    EOSA 2 05/16/2017    ABC 0.00 05/16/2017    MCV 82.0 07/22/2017    MCH 27.3 07/22/2017    MCHC 33.3 07/22/2017 MPV 8.3 07/22/2017    RDW 17.5 07/22/2017         General Chemistry:   Lab Results   Component Value Date    NA 134 05/16/2017    K 3.6 05/16/2017    CL 105 05/16/2017    CO2 20 05/16/2017    GAP 9 05/16/2017    BUN 18 05/16/2017    CR 0.51 05/16/2017    GLU 107 05/16/2017    CA 9.4 05/16/2017    ALBUMIN 4.2 05/16/2017    MG 1.8 11/27/2014    TOTBILI 0.2 05/16/2017      Coagulation:  Lab Results   Component Value Date    PTT 21.8 05/16/2017    INR 1.0 05/16/2017         Anesthesia Plan    ASA score: 2   Plan: epidural  NPO status: full stomach      Informed Consent  Use of blood products discussed with patient  Blood Consent: consented      Plan discussed with: CRNA.

## 2017-07-22 NOTE — Case Management (ED)
Case Management Progress Note    NAME:Stephanie Cervantes                          MRN: [id number]              DOB:02/26/1976          AGE: 41 y.o.  ADMISSION DATE: 07/22/2017             DAYS ADMITTED: LOS: 0 days      Today???s Date: 07/22/2017    Plan  Due to language barrier, an interpreter was present during the history-taking and subsequent discussion (and for part of the physical exam) with this patient.    Interpreter mode: In person  Interpreter/ ID Number: [place]  Pt is a 41 yo G8P5 who was admitted at 21.3wga for vaginal bleeding and placenta previa. Pt is having a baby girl.  NCM will continue to follow and reassess for support, resources, and D/C planning.       Interventions  ? Support   Support: Pt/Family Updates re:POC or DC Plan, Patient Education, Counseling for Psychosocial issues    NCM met with pt at bedside, introduced self, and explained role. NCM provided contact information.    NCM informed patient about free parking for support person. NCM provided patient with a visitor card.    ? Info or Referral   Information or Referral to [employer] Resources: Mental Health Resources    NCM discussed PPD and baby blues. Provided written info, and a list of local resources. Reviewed symptoms to watch for, when to speak with MD and when to seek care in the ED. Encouraged pt to share info with her SO and close family.     ? 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 Medication Needs   Medication Needs: No Needs Identified  ? Financial   Financial: Chiropractor, Medicare/Medicaid/SSDI Information  ? Legal   Legal: No Needs Identified  ? Other   Other/None: No needs identified    Disposition  ? Expected Discharge Date       ? Transportation Does the patient need discharge transport arranged?: No  Transportation Name, Phone and Availability #1: [facility] - Darlina Guys [phone]  ? Next Level of Care (Acute Psych discharges only)      ? Discharge Disposition            Case Management Admission Assessment    NAME:Stephanie Cervantes                          MRN: [id number]             DOB:1976-05-23          AGE: 41 y.o.  ADMISSION DATE: 07/22/2017             DAYS ADMITTED: LOS: 0 days      Today???s Date: 07/22/2017    Source of Information: Patient and EMR  Plan  Plan: Psychosocial Assessment, CM Assessment, Assist PRN with SW/NCM Services, Discharge Planning for Home Anticipated    Patient Address/Phone  [address] [place] [address]  [phone] (home)     Emergency Contact  Extended Emergency Contact Information  Primary Emergency Contact: Ala Dach [place]  Home Phone: [phone]  Relation: Significant Other    Healthcare Directive  Healthcare Directive: No, patient does not have a healthcare directive  Would patient like to fill out a (a new) Healthcare Directive?: No, patient declined      Transportation  Does the patient need discharge transport arranged?: No  Transportation Name, Phone and Availability #1: [facility] - Darlina Guys [phone]    Expected Discharge Date       Living Situation Prior to Admission  ? Living Arrangements  Type of Residence: Home, independent  Living Arrangements: Spouse/significant other, Other (Comment), Children (Pt lives with her spouse and 3 kids (3, 5 and 35 yo). Other 2 kids (10 and 14 are in [place] with grandmother). Another couple also lives in the home.)  Assistance needed prior to admit or anticipated on discharge: No  Who provides assistance or could if needed?: FOB - Darlina Guys  Are they in good health?: Yes  Can support system provide 24/7 care if needed?: Yes   Address and phone numbers verified.    ? Level of Function   Prior level of function: Independent  ? Cognitive Abilities Cognitive Abilities: Alert and Oriented, Engages in problem solving and planning, Participates in decision making    Financial Resources  ? Coverage  Primary Insurance: No insurance  Secondary Insurance: No insurance  Additional Coverage: None    ? Source of Income   Source Of Income: Other (comment) (FOB works in [employer])  ? Financial Assistance Needed?      Psychosocial Needs  ? Mental Health  Mental Health History: No  ? Substance Use History  Substance Use History Screen: No  ? Other      Current/Previous Services  ? PCP  No Pcp, Na, None, None  ? Pharmacy    [facility] [address] [place], [place] - [address]  [address]  [place] [address]  Phone: [phone] Fax: [phone]    [facility] - [place], [place] - [address]  [address] [place] [address]  Phone: [phone] Fax: [phone]    ? Durable Medical Equipment   Durable Medical Equipment at home: None  ? Home Health     ? Hemodialysis or Peritoneal Dialysis     ? Tube/Enteral Feeds     ? Infusion     ? Private Duty     ? Home and [employer]     ? [facility]     ? Hospice     ? Outpatient Therapy     ? Skilled Nursing Facility/Nursing Home     ? Inpatient Rehab     ? Long-Term Acute Care Hospital     ? Acute Hospital Stay  Acute Hospital Stay: No    Zena Amos, BSN, RNC-MNN  Nurse Case Manager - [place]  Office: [phone]  Pager: [phone]  Work Cell: [phone]

## 2017-07-22 NOTE — Progress Notes
labs obtained and labeled at bedside.

## 2017-07-23 MED ORDER — ACETAMINOPHEN 500 MG PO TAB
1000 mg | ORAL | 0 refills | Status: DC | PRN
Start: 2017-07-23 — End: 2017-07-25
  Administered 2017-07-24: 01:00:00 1000 mg via ORAL

## 2017-07-23 NOTE — Med Student Progress Note
Hospital Day 2    Subjective:    Patient denies bleeding overnight. No contractions. Feeling well rested.   +FM, -CTX, -LOF, -VB      Objective:    Physical Exam:  Vitals:   Vitals:    07/22/17 2155 07/22/17 2310 07/23/17 0417 07/23/17 0559   BP:  108/48 128/55 108/51   Pulse:  60 80 74   Temp: 36.6 C (97.9 F) 36.6 C (97.9 F) 36.4 C (97.5 F) 36.4 C (97.5 F)   SpO2:       Weight:       Height:           General:  No acute distress.  Heart:  Regular rate and rhythm.  Lungs: Clear to auscultation bilaterally.  Abdomen:  Soft, gravid, non-tender to palpation. Non-distended. Positive bowel sounds.  Extremities: No edema BL LE.        Assessment:    41 y.o.at 81w4dG8P5206 presents for vaginal bleeding in the setting of a known placenta previa.   Placenta previa   H/o CSx1   H/o IUFD at 74 wga   H/o PPROM        Plan:  CBC stable    Coags wnl  2 u PRBC on hold   NPO, wishes to eat   No bleeding over night.        Serita Grit, MS3

## 2017-07-23 NOTE — Progress Notes
Due to language barrier, a Spanish interpreter was present to review plan of care and to complete shift assessment.     Interpreter mode: In person  Interpreter/ ID Number: Debarah Crape

## 2017-07-24 MED ORDER — GUAIFENESIN 100 MG/5 ML PO LIQD
100 mg | ORAL | 0 refills | Status: DC | PRN
Start: 2017-07-24 — End: 2017-07-25
  Administered 2017-07-25: 12:00:00 100 mg via ORAL

## 2017-07-24 MED ORDER — BENZOCAINE-MENTHOL 6-10 MG MM LOZG
1 | ORAL | 0 refills | Status: DC | PRN
Start: 2017-07-24 — End: 2017-07-25
  Administered 2017-07-25: 12:00:00 1 via ORAL

## 2017-07-24 NOTE — Progress Notes
Presented to discuss POC with patient.  Seen with interpreter claudia and MS4 Fleet Contras.    Patient strongly desires discharge to home due to family obligations.  She states she understands risks of leaving hospital.  She wants to do what is best for baby but also feels like she needs to be home due to family issues.  I reviewed risks of leaving hospital with her including rsks of life threatening bleeding, stillbirth, or death.  Patient states she understands these risks    I discussed we would prefer to keep patient in hospital for a week if possible however could discharge tonight/tomorrow if she would sign form stating she understood these risks.  She verbalized that she did and signed form stating such.  Copy made and palced in chart    Discussed with Dr. Karl Ito, MD  Obstetrics and Gynecology PGY4

## 2017-07-25 ENCOUNTER — Encounter: Admit: 2017-07-25 | Discharge: 2017-07-25

## 2017-07-25 DIAGNOSIS — Z3A21 21 weeks gestation of pregnancy: ICD-10-CM

## 2017-07-25 DIAGNOSIS — O09522 Supervision of elderly multigravida, second trimester: ICD-10-CM

## 2017-07-25 DIAGNOSIS — O4402 Placenta previa specified as without hemorrhage, second trimester: Principal | ICD-10-CM

## 2017-07-25 MED ORDER — GUAIFENESIN 100 MG/5 ML PO LIQD
10 mL | ORAL | 1 refills | Status: SS | PRN
Start: 2017-07-25 — End: 2017-10-29

## 2017-07-25 NOTE — Progress Notes
1035 - Due to language barrier, an interpreter was present during the discharge education with this patient.    Interpreter mode: 16109  Interpreter/ ID Number: Rodena Goldmann    Educated pt regarding discharge instructions, addressed all questions and ensured understanding.  Pt verbalized understanding of all instructions and teaching.      1050 - Pt discharged to main lobby via wheelchair, all discharge instructions/teaching and belongings with pt.

## 2017-07-25 NOTE — Discharge Instructions
Reasons to notify your physician - Notifique a su Doctor en caso de:  *Loss of Fluid - Prdido de Fluido  *Vaginal bleeding like a period - Sangrado vaginal como si fuera un periodo  *Decreased fetal movement - Disminucin del movimiento fetal  *Abdominal pain or lower back pain that comes and goes every 5 minutes or less for 1 hour (This could be contractions) - Dolor abdominal o de la parte baja de la espalda, que viene y va cada 5 minutos o menos durante 1 hora (Esto podran ser contracciones)  *Chest Pain/Shortness of breath - Dolor al pecho/Falta de aire  *Temperature greater than 101 - Temperatura ms alta de 101  *Nausea/Vomiting - Nausea/Vmito  *Severe headache that does not get better within 1 hour of taking Tylenol (acetaminophen) - Dolor de cabeza severo que no mejora dentro de 1 hora de haber tomado Tylenol (acetaminophen)  *Visual changes or disturbances - Cambios o trastornos visuales  *If you fall on your stomach or if you are hit in the stomach by an object or a person - Si usted se ha cado sobre su vientre o si le han golpeado en el vientre con un objeto o por otra persona

## 2017-08-06 ENCOUNTER — Ambulatory Visit: Admit: 2017-08-06 | Discharge: 2017-08-06

## 2017-08-06 DIAGNOSIS — O09522 Supervision of elderly multigravida, second trimester: ICD-10-CM

## 2017-08-06 DIAGNOSIS — O4402 Placenta previa specified as without hemorrhage, second trimester: Principal | ICD-10-CM

## 2017-08-06 NOTE — Progress Notes
Pt is a 41 y.o. [id number] at [redacted]w[redacted]d by L=12, Estimated Date of Delivery: 11/29/17.    Recently hospitalized with bleed in the setting of complete placenta previa.  Patient reports she has not had any additional bleeding since she left the hospital.    -LOF/-VB/-CTX, +FM    FH: 25  FHR: 135        Complicating      Factors ??? AMA: Will begin BPPs at 84 wga  ??? Anemia: Hemoglobin electrophoresis today, recommended iron. She is taking.  ??? Cervical polyps with vaginal bleeding this pregnancy-polyps removed at 12 wga. 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  ??? 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 Nedra Hai. Will proceed with LROB care unless further concerns. Today, reviewed diagnosis with pt. Recommended discontinuing intercourse at 24 wga, avoiding SVE, and returning to the hospital if she has more than vaginal spotting. With her h/o C/S, also reviewed risk of placenta accreta and need for hysterectomy. Discussed possibility of resolution. She v/u.  ??? 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 H/o PTD d/t PPROM at 59 wga  ??? H/o C/S d/t failure to progress, 4 VBACs     Prenatal labs O pos, Ab neg, RI, RPR - HBs neg, HIV neg, Gc/CT -/-. Urine culture next visit.     Pap smear Co testing negative 05/2017     NT sono Low risk     1st tri serum screen Declines     Expand gene screen Declines     AFP/Quad screen Low risk.           Anatomy sono Complete posterior previa.     28wk labs      GTT      GBS          Flu vaccine Patient reports she received, however, entered as deferred on 10/6 - clarify next visit     Tdap vaccine      Rhogam vaccine Not indicated     MMR vaccine Not indicated     Contraception      Breast/Bottle feeding      Notes          Plan  Contine routine prenatal care and PNV.   Vaginal bleeding precautions given, discussed previable GA until 24 wga.  Baby friendly classes previously offered to pt, she may schedule at check out if desired.  Tours of L&D previously offered.    RTC 2 weeks    Future Appointments  Date Time Provider Department Center   08/13/2017 10:00 AM OBGYN [facility]O RM 4 [facility]         D/W Dr. Denman George, MD  Obstetrics and Gynecology, PGY2  Pager - [phone]

## 2017-08-06 NOTE — Progress Notes
1. Have you or your sexual partner traveled away from [place] in the last 12 weeks? No

## 2017-08-09 NOTE — Progress Notes
I have reviewed the case with the resident physician at the time of the visit and agree with the evaluation and plan as documented. Daaiyah Baumert, MD 

## 2017-08-13 ENCOUNTER — Ambulatory Visit: Admit: 2017-08-13 | Discharge: 2017-08-13

## 2017-08-13 DIAGNOSIS — O4412 Placenta previa with hemorrhage, second trimester: Principal | ICD-10-CM

## 2017-08-16 NOTE — Progress Notes
.  Stephanie Cervantes presents for an ultrasound encounter. Past Medical, Surgical, Family & Social History; Medications & Allergies contained in the electronic record below were not reviewed today and may not be up-to-date. Please see A/S OBGYN report for all documentation related to this encounter.    08/16/2017  Stephanie Cervantes, [name], LPN

## 2017-08-20 ENCOUNTER — Ambulatory Visit: Admit: 2017-08-20 | Discharge: 2017-08-20

## 2017-08-20 DIAGNOSIS — Z3482 Encounter for supervision of other normal pregnancy, second trimester: Principal | ICD-10-CM

## 2017-08-20 NOTE — Progress Notes
1. Have you or your sexual partner traveled away from [place] in the last 12 weeks? No

## 2017-09-02 ENCOUNTER — Encounter: Admit: 2017-09-02 | Discharge: 2017-09-02

## 2017-09-03 ENCOUNTER — Encounter: Admit: 2017-09-03 | Discharge: 2017-09-03

## 2017-09-14 ENCOUNTER — Ambulatory Visit: Admit: 2017-09-14 | Discharge: 2017-09-14

## 2017-09-14 DIAGNOSIS — Z331 Pregnant state, incidental: Principal | ICD-10-CM

## 2017-09-16 NOTE — Progress Notes
.  Stephanie Cervantes presents for an ultrasound encounter. Past Medical, Surgical, Family & Social History; Medications & Allergies contained in the electronic record below were not reviewed today and may not be up-to-date. Please see A/S OBGYN report for all documentation related to this encounter.    09/16/2017  Tucker Steedley, Kathie RhodesBETTY, LPN

## 2017-09-23 ENCOUNTER — Ambulatory Visit: Admit: 2017-09-23 | Discharge: 2017-09-23

## 2017-09-23 DIAGNOSIS — Z3483 Encounter for supervision of other normal pregnancy, third trimester: Principal | ICD-10-CM

## 2017-09-23 LAB — CBC
Lab: 14 % (ref 11–15)
Lab: 189 10*3/uL (ref 150–400)
Lab: 26 pg (ref 26–34)
Lab: 3.9 M/UL — ABNORMAL LOW (ref 4.0–5.0)
Lab: 31 % — ABNORMAL LOW (ref 36–45)
Lab: 32 g/dL (ref 32.0–36.0)
Lab: 6.3 10*3/uL (ref 4.5–11.0)
Lab: 8.5 FL (ref >60)
Lab: 81 FL (ref 80–100)

## 2017-09-23 LAB — HIV 1& 2 AG-AB SCRN W REFLEX HIV 1 PCR QUANT: Lab: NEGATIVE MMOL/L — AB (ref 98–110)

## 2017-09-23 LAB — SYPHILIS AB SCREEN: Lab: NEGATIVE mg/dL — ABNORMAL HIGH (ref 7–25)

## 2017-09-23 LAB — GLUCOSE TOL-1 HR: Lab: 173 mg/dL (ref 0–0.20)

## 2017-09-23 LAB — GLUCOSE, FASTING: Lab: 84 mg/dL — ABNORMAL LOW (ref 70–100)

## 2017-09-23 LAB — GLUCOSE TOL-2 HR: Lab: 138 mg/dL — ABNORMAL HIGH (ref >60)

## 2017-09-23 NOTE — Progress Notes
Pt is a 41 y.o. [id number] at 62w3dby L=12, Estimated Date of Delivery: 11/29/17.  Doing well today.    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 Urine culture with no growth.     Pap smear Co testing negative 05/2017     NT sono Low risk     1st tri serum screen Declines     Expand gene screen Declines     AFP/Quad screen Low risk.           Anatomy sono Complete posterior previa. Now low-lying placenta.     28wk labs Ordered today.     GTT Ordered today.     GBS          [facility]     [facility]     Rhogam vaccine Not indicated     MMR vaccine Not indicated     Contraception      Breast/Bottle feeding      Notes          Plan  Contine routine prenatal care and PNV.   Vaginal bleeding precautions given, discussed delivery planning pending repeat U/S.  TVUS at 36 weeks if placental location unclear or in question.  RTC 2 weeks with Dr. HSheran Lawless    Future Appointments   Date Time Provider DEtna  10/05/2017  9:45 AM OBGYN [facility] RM 3 [facility]   10/07/2017  9:00 AM LGala Murdoch MD [facility]   10/21/2017 10:15 AM LGala Murdoch MD [facility]     Patient discussed with Dr. RReece Levy    CGala Murdoch MD  Obstetrics and Gynecology, PGY2  Pager - [phone]

## 2017-09-23 NOTE — Progress Notes
1. Have you or your sexual partner traveled away from [place] in the last 12 weeks? No

## 2017-09-30 ENCOUNTER — Encounter: Admit: 2017-09-30 | Discharge: 2017-09-30

## 2017-09-30 ENCOUNTER — Inpatient Hospital Stay
Admit: 2017-09-30 | Discharge: 2017-10-01 | Discharge status: Against Medical Advice | Attending: Maternal & Fetal Medicine | Admitting: Maternal & Fetal Medicine

## 2017-09-30 ENCOUNTER — Ambulatory Visit: Admit: 2017-09-30 | Discharge: 2017-09-30

## 2017-09-30 ENCOUNTER — Inpatient Hospital Stay: Admit: 2017-09-30 | Discharge: 2017-09-30

## 2017-09-30 DIAGNOSIS — R87619 Unspecified abnormal cytological findings in specimens from cervix uteri: Principal | ICD-10-CM

## 2017-09-30 DIAGNOSIS — A150 Tuberculosis of lung: ICD-10-CM

## 2017-09-30 DIAGNOSIS — O09523 Supervision of elderly multigravida, third trimester: ICD-10-CM

## 2017-09-30 DIAGNOSIS — N939 Abnormal uterine and vaginal bleeding, unspecified: ICD-10-CM

## 2017-09-30 LAB — CBC
Lab: 10 g/dL — ABNORMAL LOW (ref 12.0–15.0)
Lab: 14 % (ref 11–15)
Lab: 26 pg (ref 26–34)
Lab: 3.9 M/UL — ABNORMAL LOW (ref 4.0–5.0)
Lab: 32 g/dL (ref 32.0–36.0)
Lab: 5.9 10*3/uL (ref 4.5–11.0)
Lab: 8.6 FL (ref 7–11)
Lab: 81 FL (ref 80–100)
Lab: 14 % (ref 11–15)
Lab: 180 10*3/uL (ref 150–400)
Lab: 26 pg (ref 26–34)
Lab: 32 g/dL (ref 32.0–36.0)
Lab: 6.9 10*3/uL (ref 4.5–11.0)
Lab: 8.2 FL (ref 7–11)
Lab: 81 FL (ref 80–100)

## 2017-09-30 LAB — URINALYSIS DIPSTICK POC
Lab: 1 K/UL (ref 1.003–1.035)
Lab: 7.5 FL (ref 5.0–8.0)
Lab: NEGATIVE % (ref 0–5)
Lab: NEGATIVE % (ref 4–12)
Lab: NEGATIVE % — ABNORMAL HIGH (ref 41–77)
Lab: NEGATIVE % — ABNORMAL LOW (ref 24–44)
Lab: NEGATIVE K/UL (ref 0–0.80)
Lab: NEGATIVE K/UL — ABNORMAL LOW (ref 1.0–4.8)

## 2017-09-30 LAB — FIBRINOGEN: Lab: 423 mg/dL — ABNORMAL HIGH (ref 200–400)

## 2017-09-30 LAB — SYPHILIS AB SCREEN: Lab: NEGATIVE % — ABNORMAL LOW (ref 36–45)

## 2017-09-30 LAB — PTT (APTT): Lab: 25 s — ABNORMAL LOW (ref 24.0–36.5)

## 2017-09-30 LAB — PROTIME INR (PT): Lab: 1 M/UL — ABNORMAL LOW (ref 0.8–1.2)

## 2017-09-30 MED ORDER — LACTATED RINGERS IV SOLP
INTRAVENOUS | 0 refills | Status: DC
Start: 2017-09-30 — End: 2017-09-30
  Administered 2017-09-30: 12:00:00 1000.000 mL via INTRAVENOUS

## 2017-09-30 MED ORDER — MISOPROSTOL 200 MCG PO TAB
800 ug | Freq: Once | RECTAL | 0 refills | Status: AC
Start: 2017-09-30 — End: ?

## 2017-09-30 MED ORDER — OXYTOCIN IN 0.9 % SOD CHLORIDE 30 UNIT/500 ML IV SOLN
30 [IU] | Freq: Once | INTRAVENOUS | 0 refills | Status: AC
Start: 2017-09-30 — End: ?

## 2017-09-30 MED ORDER — BETAMETHASONE ACET,SOD PHOS 6 MG/ML IJ SUSP
12 mg | INTRAMUSCULAR | 0 refills | Status: CP
Start: 2017-09-30 — End: ?
  Administered 2017-09-30 – 2017-10-01 (×2): 12 mg via INTRAMUSCULAR

## 2017-09-30 MED ORDER — LEVONORGESTREL 20 MCG/24 HR (5 YEARS) IU IUD
1 | Freq: Once | INTRAUTERINE | 0 refills | Status: AC
Start: 2017-09-30 — End: ?

## 2017-09-30 MED ORDER — ACETAMINOPHEN 325 MG PO TAB
650 mg | ORAL | 0 refills | Status: DC | PRN
Start: 2017-09-30 — End: 2017-10-01
  Administered 2017-10-01: 03:00:00 650 mg via ORAL

## 2017-09-30 MED ORDER — SODIUM CITRATE-CITRIC ACID 500-334 MG/5 ML PO SOLN
30 mL | Freq: Once | ORAL | 0 refills | Status: AC | PRN
Start: 2017-09-30 — End: ?

## 2017-09-30 NOTE — Progress Notes
Progress Note    Stephanie Cervantes  Admission Date: 09/30/2017    S:  The patient reports she continues to experience vaginal bleeding. She also notes contractions q20-80min. She denies any headache, SOB, nausea, vomiting, or abdominal pain.    ROS:  A complete review of systems was negative except as described above.    O:    Vitals:    09/30/17 0514 09/30/17 0515 09/30/17 0529 09/30/17 0530   BP:  117/56  111/62   Pulse:  67  74   Temp:       SpO2: 96%  96%    Weight:       Height:           Gen: NAD  Cardio: Pulses equal in BLE.  Pulm: No increased work of breathing on exam.  Abdomen: soft, gravid, nttp    No current facility-administered medications on file prior to encounter.      Current Outpatient Medications on File Prior to Encounter   Medication Sig Dispense Refill   ??? guaiFENesin (ROBITUSSIN) 100 mg/5 mL oral solution Take 10 mL by mouth every 6 hours as needed. 118 mL 1   ??? vitamins, prenatal w/iron & folate 65/1 mg tab Take 1 tablet by mouth daily.       Results for orders placed or performed during the hospital encounter of 09/30/17 (from the past 48 hour(s))   URINALYSIS DIPSTICK POC    Collection Time: 09/30/17  5:53 AM   # # Low-High    Urine Color POC YELLOW     Urine Turbidity POC CLEAR CLEAR-CLEAR    Urine Specific Gravity POC 1.010 1.003 - 1.035    Urine PH POC 7.5 5.0 - 8.0    Urine Protein POC NEG NEG-NEG    Urine Glucose POC NEG NEG-NEG    Urine Ketone POC NEG NEG-NEG    Urine Bilirubin POC NEG NEG-NEG    Urine Blood POC 3+ (A) NEG-NEG    Urine Urobilinogen POC NORMAL NORM-NORMAL    Urine Nitrite POC NEG NEG-NEG    Urine Leukocytes POC NEG NEG-NEG   TYPE & CROSSMATCH    Collection Time: 09/30/17  6:00 AM   # # Low-High    Units Ordered 2     Crossmatch Expires 10/03/2017     Record Check FOUND     ABO/RH(D)      Antibody Screen                          Assessment: 40 y.o.  [id number] @ [redacted]w[redacted]d presents for vaginal bleeding in the setting of a known placenta previa.    Placenta previa H/o CSx1, Vbac x 6  H/o IUFD at 35 wga   H/o PPROM   Desires BTL  ???  Plan:    CEFM/TOCO  CAFC sono today- last sono of 11/26 showed marginal previa  Will start magnesium if bleeding increases or starts contracting    Henrene Pastor, MS3  OB/GYN

## 2017-09-30 NOTE — Anesthesia Pre-Procedure Evaluation
Anesthesia Pre-Procedure Evaluation    Name: Stephanie Cervantes      MRN: [id number]     DOB: [birth year]     Age: 41 y.o.     Sex: female   __________________________________________________________________________     Procedure Date: 09/30/2017   Procedure: * No procedures listed *     Physical Assessment  Vital Signs (last filed in past 24 hours):  Temp: 36.6 ???C (97.8 ???F) (12/13 0449)  Respirations: 18 PER MINUTE (12/13 0449)  Height: 149.9 cm (59.02) (12/13 0449)  Weight: 75.5 kg (166 lb 6.4 oz) (12/13 0449)      Patient History  No Known Allergies     Current Medications    Medication Directions   guaiFENesin (ROBITUSSIN) 100 mg/5 mL oral solution Take 10 mL by mouth every 6 hours as needed.   vitamins, prenatal w/iron & folate 65/1 mg tab Take 1 tablet by mouth daily.         Review of Systems/Medical History      Patient summary reviewed  Nursing notes reviewed  Pertinent labs reviewed    PONV Screening: Female gender and Non-smoker  No history of anesthetic complications  No family history of anesthetic complications      Airway - negative        Pulmonary - negative          Cardiovascular - negative        Exercise tolerance: >4 METS      GI/Hepatic/Renal - negative        Neuro/Psych - negative        Musculoskeletal - negative        Endocrine/Other         Pregnant; GA:[redacted]w[redacted]d, [id number]          Prior C-section; TOLAC candidate                      Placental abnormality: placenta previa   Physical Exam    Airway Findings      Mallampati: II      TM distance: >3 FB      Neck ROM: full      Mouth opening: good      Airway patency: adequate    Cardiovascular Findings:       Rhythm: regular      Rate: normal    Pulmonary Findings:       Breath sounds clear to auscultation.    Neurological Findings: Negative         Diagnostic Tests  Hematology:   Lab Results   Component Value Date    HGB 10.5 09/23/2017    HCT 31.9 09/23/2017    PLTCT 189 09/23/2017    WBC 6.3 09/23/2017    NEUT 78 05/16/2017 ANC 7.20 05/16/2017    ALC 1.20 05/16/2017    MONA 7 05/16/2017    AMC 0.70 05/16/2017    EOSA 2 05/16/2017    ABC 0.00 05/16/2017    MCV 81.5 09/23/2017    MCH 26.8 09/23/2017    MCHC 32.9 09/23/2017    MPV 8.5 09/23/2017    RDW 14.7 09/23/2017         General Chemistry:   Lab Results   Component Value Date    NA 134 05/16/2017    K 3.6 05/16/2017    CL 105 05/16/2017    CO2 20 05/16/2017    GAP 9 05/16/2017    BUN 18 05/16/2017    CR 0.51 05/16/2017  GLU 107 05/16/2017    CA 9.4 05/16/2017    ALBUMIN 4.2 05/16/2017    MG 1.8 11/27/2014    TOTBILI 0.2 05/16/2017      Coagulation:   Lab Results   Component Value Date    PTT 28.4 07/22/2017    INR 1.0 07/22/2017         Anesthesia Plan    ASA score: 2   Plan: epidural  Induction method: intravenous  NPO status: acceptable and full stomach      Informed Consent  Anesthetic plan and risks discussed with patient and spouse.  Use of blood products discussed with patient and spouse  Blood Consent: consented      Plan discussed with: anesthesiologist.

## 2017-09-30 NOTE — Progress Notes
0718-Report received from E. Amuso, RN.   0800-Doctors consented pt for Mirena post delivery. Mirena put in room.   0920-Pt to ultrasound via wheelchair.   0938-Back from ultrasound.   1330- Drs to bedside. Pt to come off continuous monitoring and able to go over to mom/baby.   1417- Report given to H. Penka, RN.   1535-Pt to [phone] via wheelchair. Care relinquished.

## 2017-09-30 NOTE — H&P (View-Only)
Labor/Delivery   Admission History and Physical Examination      Stephanie Cervantes  Admission Date: 09/30/2017                     Assessment: 41 y.o.  [id number] @ [redacted]w[redacted]d presents for vaginal bleeding in the setting of a known placenta previa.    Placenta previa   H/o CSx1, Vbac x 6  H/o IUFD at 54 wga   H/o PPROM   Desires BTL    Plan:  Admit to unit 48   CEFM/TOCO  Consented for SVD, possible cesarean section including classical, hysterectomy, ex lap and BTL  T&C, 2 u on hold   CBC, coags, KB, fibrinogen   [facility] sono today- last sono of 11/26 showed marginal previa  BTMZ x 2 doses  GBS collected  Will start magnesium if bleeding increases or starts contracting      D/w Dr. Noelle Penner     Marily Lente, MD  Obstetrics and Gynecology PGY-4  [phone]  __________________________________________________________________________________  Chief Complaint:  Vaginal bleeding    History of Present Illness: Stephanie Cervantes is a 10 y.o. [id number] @ [redacted]w[redacted]d by L=12, Estimated Date of Delivery: 11/29/17.  Pt presents with vaginal bleeding. She woke up tonight to vaginal spotting that started around 0400. She went to the bathroom, and noted a moderate amount of bleeding at that time. She came into ED because she was told to present with any bleeding due to her placenta previa. She did bleed at 12 weeks during this pregnancy, and again at 21 weeks. She does have occasional contractions. She is feeling fetal movement. No leaking fluid.    Review of Systems:  A comprehensive review of systems was performed and was negative, except as in HPI    Prenatal Care:  [facility]  OB History:  OB History   Gravida Para Term Preterm AB Living   8 7 5 2  0 6   SAB TAB Ectopic Multiple Live Births   0 0 0 0 6      # Outcome Date GA Lbr Len/2nd Weight Sex Delivery Anes PTL Lv   8 Current            7 Preterm 10/15/14 [redacted]w[redacted]d  2450 g (5 lb 6.4 oz) F Vag-Spont CSE, EPI Y LIV   6 Term 2013 [redacted]w[redacted]d   M VBAC  N LIV   5 Term 2012 [redacted]w[redacted]d   M VBAC   LIV 4 Preterm 2007 [redacted]w[redacted]d   M VBAC  Y Fetal Demise   3 Term 2004 [redacted]w[redacted]d   F VBAC  N LIV   2 Term 2000 [redacted]w[redacted]d   F CS-LTranv  N LIV      Complications: Failure to Progress in First Stage   1 Term 1996 [redacted]w[redacted]d   F Spontaneous  N LIV            Gyn History:    Pap:  H/o abnormal   STD:  Negative     PmHx:     Past Medical History:   Diagnosis Date   ??? Abnormal Pap smear of cervix    ??? TB (pulmonary tuberculosis) 2005    Rx in [place] about 2005.  ID notes (11/26/14; 01/03/15; 03/06/15) review records from [place] and compare CTs from [place] and 11/27/14 at [facility] showing either slight improvement or no change. Four AFB sputupm cx were neg (at [facility] on2/9/16; 11/30/14;12/03/14;12/04/14).  1 of 4 sputum  Cx in [place] in 2013-2014 were pos for MAC. On 03/06/15 ID concludes: no active TB or MAC. Entered by Manya Silvas, MD   ??? TB (pulmonary tuberculosis) 2004       PsHx:   Past Surgical History:   Procedure Laterality Date   ??? CESAREAN SECTION      2000     FmHx:   Denies relevant  Family History   Problem Relation Age of Onset   ??? Heart Attack Mother    ??? Cancer-Breast Neg Hx    ??? Cervical Cancer Neg Hx    ??? Cancer-Colon Neg Hx    ??? Cancer-Ovarian Neg Hx    ??? Cancer-Uterine Neg Hx          Social:   Denies tobacco, EtOH, drug use  Spanish speaking     Allergies:  Patient has no known allergies.    Medications:  No current facility-administered medications on file prior to encounter.      Current Outpatient Medications on File Prior to Encounter   Medication Sig Dispense Refill   ??? guaiFENesin (ROBITUSSIN) 100 mg/5 mL oral solution Take 10 mL by mouth every 6 hours as needed. 118 mL 1   ??? vitamins, prenatal w/iron & folate 65/1 mg tab Take 1 tablet by mouth daily.           Physical Exam:  Vital Signs: Last Filed In 24 Hours                Vital Signs: 24 Hour Range   Temp: 36.6 ???C (97.8 ???F) (12/13 0449)  Respirations: 18 PER MINUTE (12/13 0449)  Height: 149.9 cm (59.02) (12/13 0449) Temp:  [36.6 ???C (97.8 ???F)] Respirations:  [18 PER MINUTE]    Intensity Pain Scale (Self Report): 3 (09/30/17 0449)      Gen: NAD  CV: Regular rate  Chest: Unlabored breathing  Abd: Gravid, nttp  Ext: No LE edema, nttp LE    SSE: Blood stained perineum. No active bleeding per vagina. Normal appearing external genitalia, redundant vaginal mucosa. Minimal old blood in vaginal vault. Cervical polyps visualized. No welling of blood or active bleeding with valsalva or at rest.   TOCO:  0/10  FHR:  140, moderate variability, reactive    Lab/Radiology/Other Diagnostic Tests:  Last U/s 11/27: Cephalic, left lateral low lying placenta. AFI 11.93, EFW: 1608g (84%); HC 12%; AC>97%; MCA centralized.     24-hour labs:    No results found for this visit on 09/30/17 (from the past 24 hour(s)).    Point of Care Testing:

## 2017-09-30 NOTE — Progress Notes
PGY3 Update    Discussed US results with pt: marginal placenta previa.  Currently no pain. Random ctx.   Baby moving well.   Bleeding stopped almost immediately after admission.    BP 124/60  - Pulse 68  - Temp 36.8 C (98.2 F)  - Ht 149.9 cm (59.02")  - Wt 75.5 kg (166 lb 6.4 oz)  - LMP 02/22/2017 (Exact Date)  - SpO2 95%  - BMI 33.59 kg/m   NAD  CEFM/TOCO with Cat I strip, no ctx    AM Hgb 9.6 from 10.7-likely dilutional, but will repeat in AM.   Coags WNL.  Plan for admission for at least 24 hours.  Can d/c CEFM-NSTs BID.    D/w Dr Ludger NuttingParrott/Gibbs  Dana AllanJackie Lianah Peed, MD

## 2017-10-01 ENCOUNTER — Encounter: Admit: 2017-10-01 | Discharge: 2017-10-01

## 2017-10-01 DIAGNOSIS — Z8611 Personal history of tuberculosis: ICD-10-CM

## 2017-10-01 DIAGNOSIS — Z3A31 31 weeks gestation of pregnancy: ICD-10-CM

## 2017-10-01 DIAGNOSIS — O4433 Partial placenta previa with hemorrhage, third trimester: Principal | ICD-10-CM

## 2017-10-01 DIAGNOSIS — O4453 Low lying placenta with hemorrhage, third trimester: ICD-10-CM

## 2017-10-01 LAB — BETA 2 GLYCOPROTEIN 1 AB, IGM: Lab: 0.6 U/mL (ref <20.0)

## 2017-10-01 LAB — CARDIOLIPIN AB IGG/IGM: Lab: 0.3 [MPL'U]/mL (ref <20.0)

## 2017-10-01 LAB — CBC: Lab: 6.5 K/UL — ABNORMAL HIGH (ref 4.5–11.0)

## 2017-10-01 LAB — BETA 2 GLYCOPROTEIN 1 AB, IGG

## 2017-10-01 NOTE — Progress Notes
OB Antepartum Progress Note      Hospital Day 2    Subjective:    Feels well. Bleeding stopped almost immediately after admission.    Desires to go home. Lives 6 minutes from the hospital and has transportation if she needs to return.     +FM, -CTX, -LOF, -VB      Objective:    Physical Exam:  Vitals:   Vitals:    09/30/17 1301 09/30/17 1512 09/30/17 1544 10/01/17 0106   BP: 115/88  123/48 102/47   Pulse: 76  80 67   Temp:  36.8 ???C (98.2 ???F) 36.6 ???C (97.9 ???F) 36.6 ???C (97.8 ???F)   SpO2:   98% 100%   Weight:       Height:           General:  No acute distress.  Heart:  Regular rate.  Lungs: Unlabored.  Abdomen:  Soft, gravid, non-tender to palpation.  Extremities: No edema BL LE.      NSTs Cat I    Lab Review:  24-hour labs:    Results for orders placed or performed during the hospital encounter of 09/30/17 (from the past 24 hour(s))   CBC    Collection Time: 09/30/17  8:04 AM   Result Value Ref Range    White Blood Cells 6.9 4.5 - 11.0 K/UL    RBC 3.61 (L) 4.0 - 5.0 M/UL    Hemoglobin 9.6 (L) 12.0 - 15.0 GM/DL    Hematocrit 16.1 (L) 36 - 45 %    MCV 81.1 80 - 100 FL    MCH 26.5 26 - 34 PG    MCHC 32.7 32.0 - 36.0 G/DL    RDW 09.6 11 - 15 %    Platelet Count 180 150 - 400 K/UL    MPV 8.2 7 - 11 FL   PROTIME INR (PT)    Collection Time: 09/30/17  8:04 AM   Result Value Ref Range    INR 1.0 0.8 - 1.2   PTT (APTT)    Collection Time: 09/30/17  8:04 AM   Result Value Ref Range    APTT 25.3 24.0 - 36.5 SEC   FIBRINOGEN    Collection Time: 09/30/17  8:04 AM   Result Value Ref Range    Fibrinogen 423 (H) 200 - 400 MG/DL         Assessment:  41 y.o.  [id number] @ [redacted]w[redacted]d with second bleed in the setting of known placenta previa, now found to be marginal in nature.   Placenta previa   H/o CSx1, Vbac x 6  H/o IUFD at 26 wga   H/o PPROM   Desires BTL    Plan:  Placenta Previa, now with second bleed  ??? First diagnosed as complete previa on 20 week scan  ??? Did have bleeding 12wga and likely related to polyps. She was admitted for second bleed in the third trimester, she now is being admitted for third bleed (2nd bleed related to previa).   ??? CBCs/coags normal on admit  ??? NSTs BID  ??? Now with second bleed  ??? Reviewed risk for accreta  ??? Consented for classical C/S, hysterectomy, BTL, and IUD placement. Desires BTL, but cannot pay at this time.   ??? NICU consult if no discharge  ??? GBS collected because placental location uncertain on admit  ??? Sp BMTZ 12/13-12/14  ??? Ultrasounds  ??? 12/13: Cephalic, AFI 14, left lateral marginal previa   ??? 11/27: Cephalic, left lateral  low lying placenta. AFI 11.93, EFW: 1608g (84%); HC 12%; AC>97%; MCA centralized.     AMA  ??? Begin BPPs at 32 wga  ??? Will schedule today if she gets discharged    H/o TB  ??? Cured per ID note    H/o C/S  ??? One C/S d/t failure to progress, 4 subsequent VBACs  ??? Pt understands risk for accreta and subsequent hysterectomy, consented on admission    H/o IUFD  ??? At 28 wga  ??? Also had 35 week PPROM delivery, remainder of pregnancies without significant morbidity  ??? APAS testing collected today    Discussed significant risk for recurrent bleed. Recommended inpatient monitoring given risk of morbidity if recurrent bleed. Unfortunately, patient cannot remain inpatient d/t childcare. She v/u of risk of recurrent bleed resulting in significant morbidity to Mom or Baby, she understands this could even result in maternal or fetal death. She requests to sign out AMA. She reports she understands return precautions and limitations on activity/intercourse with a previa.     She knows her upcoming [place] time/date.     BPPs starting next week given AMA/previa.   Will review delivery planning in clinic.     Future Appointments   Date Time Provider Department Center   10/05/2017  9:45 AM OBGYN [facility]   10/08/2017 11:30 AM Dana Allan, MD [facility]   10/22/2017 10:00 AM Dana Allan, MD [facility]     D/w Dr. Alfonse Flavors, MD PGY-3 Obstetrics and Gynecology

## 2017-10-01 NOTE — Progress Notes
Patient resting comfortably in bed. Patient reports +FM, denies VB, LOF, and CTX. Patient denies HA, epigastric pain, RUQ pain, dizziness, visual changes, N/V, and fundal tenderness. Pt reports desire to discharge from hospital. Reviewed with request with Dr. Mosie EpsteinHerzberg who reported to bedside with telephonic interpreter. Information was reviewed with patient regarding signs of bleeding and other symptoms to return to the hospital. Pt denies any further bleeding since admission, reports living very close to hospital and having no concerns regarding ability to get transportation to the hospital if needed. Pt verbalized understanding of risks, states need to be home to care for other children. AMA form reviewed and discharge instructions gone over with pt. Pt able to state when and where to appear for next appointment. No questions at this time. Pt to walk off unit with husband to transport home.

## 2017-10-02 LAB — CULTURE-STREP SCREEN: Lab: NEGATIVE

## 2017-10-05 ENCOUNTER — Ambulatory Visit: Admit: 2017-10-05 | Discharge: 2017-10-05

## 2017-10-05 DIAGNOSIS — Z98891 History of uterine scar from previous surgery: Principal | ICD-10-CM

## 2017-10-06 LAB — HEX LUPUS ANTICOAGULANT: Lab: 5

## 2017-10-06 NOTE — Progress Notes
Stephanie Cervantes presents for an ultrasound encounter. Past Medical, Surgical, Family & Social History; Medications & Allergies contained in the electronic record below were not reviewed today and may not be up-to-date. Please see A/S OBGYN report for all documentation related to this encounter.    10/06/2017  Stephanie CraftsVanessa Chloe Flis, LPN

## 2017-10-08 NOTE — Progress Notes
I have reviewed the case with Dr. Linscheid at the time of the visit and agree with the evaluation and plan as documented by the resident.

## 2017-10-14 ENCOUNTER — Encounter: Admit: 2017-10-14 | Discharge: 2017-10-14

## 2017-10-15 ENCOUNTER — Ambulatory Visit: Admit: 2017-10-15 | Discharge: 2017-10-16

## 2017-10-22 ENCOUNTER — Encounter: Admit: 2017-10-22 | Discharge: 2017-10-22

## 2017-10-22 ENCOUNTER — Ambulatory Visit: Admit: 2017-10-22 | Discharge: 2017-10-22 | Payer: MEDICAID

## 2017-10-22 DIAGNOSIS — O0993 Supervision of high risk pregnancy, unspecified, third trimester: Principal | ICD-10-CM

## 2017-10-22 DIAGNOSIS — O34219 Maternal care for unspecified type scar from previous cesarean delivery: Principal | ICD-10-CM

## 2017-10-25 ENCOUNTER — Encounter: Admit: 2017-10-25 | Discharge: 2017-10-25

## 2017-10-25 DIAGNOSIS — O4403 Placenta previa specified as without hemorrhage, third trimester: ICD-10-CM

## 2017-10-25 DIAGNOSIS — O4420 Partial placenta previa NOS or without hemorrhage, unspecified trimester: Principal | ICD-10-CM

## 2017-10-25 DIAGNOSIS — Z3490 Encounter for supervision of normal pregnancy, unspecified, unspecified trimester: ICD-10-CM

## 2017-10-25 LAB — PTT (APTT): Lab: 25 s (ref 24.0–36.5)

## 2017-10-28 ENCOUNTER — Encounter: Admit: 2017-10-28 | Discharge: 2017-10-28

## 2017-10-28 ENCOUNTER — Inpatient Hospital Stay: Admit: 2017-10-28 | Discharge: 2017-10-31 | Disposition: A | Discharge status: Home / Self Care | Payer: MEDICAID

## 2017-10-28 DIAGNOSIS — R87619 Unspecified abnormal cytological findings in specimens from cervix uteri: Principal | ICD-10-CM

## 2017-10-28 DIAGNOSIS — A150 Tuberculosis of lung: ICD-10-CM

## 2017-10-28 LAB — PROTEIN/CR RATIO,UR RAN
Lab: 0.5
Lab: 18 mg/dL
Lab: 9 mg/dL

## 2017-10-28 LAB — PROTIME INR (PT): Lab: 0.9 (ref 0.8–1.2)

## 2017-10-28 LAB — CBC
Lab: 10 g/dL — ABNORMAL LOW (ref 12.0–15.0)
Lab: 15 % (ref 11–15)
Lab: 217 10*3/uL (ref 150–400)
Lab: 26 pg (ref 26–34)
Lab: 3.9 M/UL — ABNORMAL LOW (ref 4.0–5.0)
Lab: 31 % — ABNORMAL LOW (ref 36–45)
Lab: 33 g/dL (ref 32.0–36.0)
Lab: 6.6 10*3/uL (ref 4.5–11.0)
Lab: 79 FL — ABNORMAL LOW (ref 80–100)
Lab: 9 FL (ref 7–11)

## 2017-10-28 LAB — BLOOD GASES-CORD BLOOD
Lab: 0.5 MMOL/L
Lab: 23 mmHg
Lab: 50 %
Lab: 53 mmHg
Lab: 7.3
Lab: 7.3 mmHg

## 2017-10-28 LAB — FIBRINOGEN: Lab: 459 mg/dL — ABNORMAL HIGH (ref 200–400)

## 2017-10-28 LAB — POC BLOOD GAS VEN
Lab: 22 MMOL/L
Lab: 4 MMOL/L
Lab: 48 mmHg (ref 36–50)
Lab: 7.2 — ABNORMAL LOW (ref 7.30–7.40)
Lab: 78 mmHg — ABNORMAL HIGH (ref 33–48)
Lab: 93 % — ABNORMAL HIGH (ref 55–71)

## 2017-10-28 LAB — POC SODIUM: Lab: 139 MMOL/L (ref 137–147)

## 2017-10-28 LAB — POC POTASSIUM: Lab: 3.5 MMOL/L (ref 3.5–5.1)

## 2017-10-28 LAB — POC IONIZED CALCIUM: Lab: 1.2 MMOL/L (ref 1.0–1.3)

## 2017-10-28 LAB — POC HEMATOCRIT
Lab: 11 g/dL — ABNORMAL LOW (ref 12.0–15.0)
Lab: 33 % — ABNORMAL LOW (ref 36–45)

## 2017-10-28 MED ORDER — FENTANYL CITRATE (PF) 50 MCG/ML IJ SOLN
50 ug | INTRAVENOUS | 0 refills | Status: DC | PRN
Start: 2017-10-28 — End: 2017-10-29

## 2017-10-28 MED ORDER — LACTATED RINGERS IV SOLP
INTRAVENOUS | 0 refills | Status: DC
Start: 2017-10-28 — End: 2017-10-29
  Administered 2017-10-28 – 2017-10-29 (×2): 1000.000 mL via INTRAVENOUS

## 2017-10-28 MED ORDER — SODIUM CITRATE-CITRIC ACID 500-334 MG/5 ML PO SOLN
30 mL | Freq: Once | ORAL | 0 refills | Status: DC | PRN
Start: 2017-10-28 — End: 2017-10-28

## 2017-10-28 MED ORDER — AZITHROMYCIN 500 MG IVPB (AN)(OSM)
INTRAVENOUS | 0 refills | Status: DC
Start: 2017-10-28 — End: 2017-10-28
  Administered 2017-10-28 (×2): 500 mg via INTRAVENOUS

## 2017-10-28 MED ORDER — OXYTOCIN IN 0.9 % SOD CHLORIDE 30 UNIT/500 ML IV SOLN
30 [IU] | Freq: Once | INTRAVENOUS | 0 refills | Status: DC
Start: 2017-10-28 — End: 2017-10-28

## 2017-10-28 MED ORDER — FENTANYL CITRATE (PF) 50 MCG/ML IJ SOLN
0 refills | Status: DC
Start: 2017-10-28 — End: 2017-10-28
  Administered 2017-10-28: 19:00:00 100 ug via INTRAVENOUS

## 2017-10-28 MED ORDER — MIDAZOLAM 1 MG/ML IJ SOLN
INTRAVENOUS | 0 refills | Status: DC
Start: 2017-10-28 — End: 2017-10-28
  Administered 2017-10-28: 19:00:00 2 mg via INTRAVENOUS

## 2017-10-28 MED ORDER — DEXTRAN 70-HYPROMELLOSE (PF) 0.1-0.3 % OP DPET
0 refills | Status: DC
Start: 2017-10-28 — End: 2017-10-28
  Administered 2017-10-28: 19:00:00 2 [drp] via OPHTHALMIC

## 2017-10-28 MED ORDER — OXYTOCIN IN 0.9 % SOD CHLORIDE 30 UNIT/500 ML IV SOLN
30 [IU] | Freq: Once | INTRAVENOUS | 0 refills | Status: AC | PRN
Start: 2017-10-28 — End: ?

## 2017-10-28 MED ORDER — NALOXONE 0.4 MG/ML IJ SOLN
.08 mg | INTRAVENOUS | 0 refills | Status: DC | PRN
Start: 2017-10-28 — End: 2017-10-31

## 2017-10-28 MED ORDER — SUCCINYLCHOLINE CHLORIDE 20 MG/ML IJ SOLN
INTRAVENOUS | 0 refills | Status: DC
Start: 2017-10-28 — End: 2017-10-28
  Administered 2017-10-28: 19:00:00 100 mg via INTRAVENOUS

## 2017-10-28 MED ORDER — ACETAMINOPHEN 325 MG PO TAB
650 mg | ORAL | 0 refills | Status: DC
Start: 2017-10-28 — End: 2017-10-30
  Administered 2017-10-29 – 2017-10-30 (×7): 650 mg via ORAL

## 2017-10-28 MED ORDER — LACTATED RINGERS IV SOLP
INTRAVENOUS | 0 refills | Status: DC
Start: 2017-10-28 — End: 2017-10-28
  Administered 2017-10-28: 19:00:00 1000.000 mL via INTRAVENOUS

## 2017-10-28 MED ORDER — FAMOTIDINE (PF) 20 MG/2 ML IV SOLN
INTRAVENOUS | 0 refills | Status: DC
Start: 2017-10-28 — End: 2017-10-28
  Administered 2017-10-28 (×2): 20 mg via INTRAVENOUS

## 2017-10-28 MED ORDER — PROPOFOL INJ 10 MG/ML IV VIAL
INTRAVENOUS | 0 refills | Status: DC
Start: 2017-10-28 — End: 2017-10-28
  Administered 2017-10-28: 19:00:00 140 mg via INTRAVENOUS

## 2017-10-28 MED ORDER — TRANEXAMIC ACID 1 G IVPB
0 refills | Status: DC
Start: 2017-10-28 — End: 2017-10-28
  Administered 2017-10-28 (×2): 1 g via INTRAVENOUS

## 2017-10-28 MED ORDER — PRENATAL VIT-IRON FUM-FOLIC AC 28 MG IRON- 800 MCG PO TAB
1 | Freq: Every evening | ORAL | 0 refills | Status: DC
Start: 2017-10-28 — End: 2017-10-31
  Administered 2017-10-30 – 2017-10-31 (×2): 1 via ORAL

## 2017-10-28 MED ORDER — LANOLIN TP CREA
TOPICAL | 0 refills | Status: DC | PRN
Start: 2017-10-28 — End: 2017-10-31

## 2017-10-28 MED ORDER — METOCLOPRAMIDE HCL 5 MG/ML IJ SOLN
INTRAVENOUS | 0 refills | Status: DC
Start: 2017-10-28 — End: 2017-10-28
  Administered 2017-10-28 (×2): 10 mg via INTRAVENOUS

## 2017-10-28 MED ORDER — FAMOTIDINE 20 MG PO TAB
20 mg | Freq: Two times a day (BID) | ORAL | 0 refills | Status: DC | PRN
Start: 2017-10-28 — End: 2017-10-31

## 2017-10-28 MED ORDER — ONDANSETRON HCL (PF) 4 MG/2 ML IJ SOLN
4 mg | INTRAVENOUS | 0 refills | Status: DC | PRN
Start: 2017-10-28 — End: 2017-10-31

## 2017-10-28 MED ORDER — HALOPERIDOL LACTATE 5 MG/ML IJ SOLN
1 mg | Freq: Once | INTRAVENOUS | 0 refills | Status: AC | PRN
Start: 2017-10-28 — End: ?

## 2017-10-28 MED ORDER — OXYCODONE 5 MG PO TAB
5-10 mg | ORAL | 0 refills | Status: DC | PRN
Start: 2017-10-28 — End: 2017-10-29

## 2017-10-28 MED ORDER — MISOPROSTOL 200 MCG PO TAB
800 ug | Freq: Once | RECTAL | 0 refills | Status: DC
Start: 2017-10-28 — End: 2017-10-28
  Administered 2017-10-28: 20:00:00 800 ug via RECTAL

## 2017-10-28 MED ORDER — MAGNESIUM HYDROXIDE 2,400 MG/10 ML PO SUSP
10 mL | ORAL | 0 refills | Status: DC | PRN
Start: 2017-10-28 — End: 2017-10-31
  Administered 2017-10-29 – 2017-10-31 (×2): 10 mL via ORAL

## 2017-10-28 MED ORDER — NEONATAL PERIPHERAL (D10W) PARENTERAL NUTRITION
250 mL | 0 refills | Status: DC
Start: 2017-10-28 — End: 2017-10-28

## 2017-10-28 MED ORDER — KETAMINE 10 MG/ML IJ SOLN
0 refills | Status: DC
Start: 2017-10-28 — End: 2017-10-28
  Administered 2017-10-28: 19:00:00 30 mg via INTRAVENOUS

## 2017-10-28 MED ORDER — FAT EMULSION 20% IV 100ML
2 g/kg | INTRAVENOUS | 0 refills | Status: DC
Start: 2017-10-28 — End: 2017-10-28

## 2017-10-28 MED ORDER — FENTANYL CITRATE (PF) 50 MCG/ML IJ SOLN
50 ug | Freq: Once | INTRAVENOUS | 0 refills | Status: CP
Start: 2017-10-28 — End: ?

## 2017-10-28 MED ORDER — SIMETHICONE 80 MG PO CHEW
80 mg | ORAL | 0 refills | Status: DC | PRN
Start: 2017-10-28 — End: 2017-10-31
  Administered 2017-10-29 – 2017-10-30 (×3): 80 mg via ORAL

## 2017-10-28 MED ORDER — FENTANYL CITRATE (PF) 50 MCG/ML IJ SOLN
25 ug | INTRAVENOUS | 0 refills | Status: DC | PRN
Start: 2017-10-28 — End: 2017-10-29

## 2017-10-28 MED ORDER — DOCUSATE SODIUM 100 MG PO CAP
100 mg | Freq: Two times a day (BID) | ORAL | 0 refills | Status: DC
Start: 2017-10-28 — End: 2017-10-31
  Administered 2017-10-29 – 2017-10-31 (×6): 100 mg via ORAL

## 2017-10-28 MED ORDER — LIDOCAINE (PF) 200 MG/10 ML (2 %) IJ SYRG
0 refills | Status: DC
Start: 2017-10-28 — End: 2017-10-28
  Administered 2017-10-28: 19:00:00 80 mg via INTRAVENOUS

## 2017-10-28 MED ORDER — SODIUM CITRATE-CITRIC ACID 500-334 MG/5 ML PO SOLN
ORAL | 0 refills | Status: DC
Start: 2017-10-28 — End: 2017-10-28
  Administered 2017-10-28 (×2): 30 mL via ORAL

## 2017-10-28 MED ORDER — ACETAMINOPHEN 500 MG PO TAB
1000 mg | Freq: Once | ORAL | 0 refills | Status: CP
Start: 2017-10-28 — End: ?
  Administered 2017-10-28: 21:00:00 1000 mg via ORAL

## 2017-10-28 MED ORDER — OXYCODONE 5 MG PO TAB
5-10 mg | Freq: Once | ORAL | 0 refills | Status: AC | PRN
Start: 2017-10-28 — End: ?

## 2017-10-28 MED ORDER — CEFAZOLIN 1 GRAM IJ SOLR
INTRAVENOUS | 0 refills | Status: DC
Start: 2017-10-28 — End: 2017-10-28
  Administered 2017-10-28: 19:00:00 2 g via INTRAVENOUS

## 2017-10-28 MED ORDER — OXYTOCIN IN 0.9 % SOD CHLORIDE 30 UNIT/500 ML IV SOLN (OR)
0 refills | Status: DC
Start: 2017-10-28 — End: 2017-10-28
  Administered 2017-10-28: 19:00:00 via INTRAVENOUS

## 2017-10-28 MED ORDER — MORPHINE PCA 55 MG/55 ML SYR (STD CONC)(ADULT)(PREMADE-QUVA)
INTRAVENOUS | 0 refills | Status: DC
Start: 2017-10-28 — End: 2017-10-29
  Administered 2017-10-28 (×2): 55.000 mL via INTRAVENOUS

## 2017-10-28 MED ORDER — IBUPROFEN 600 MG PO TAB
600 mg | ORAL | 0 refills | Status: CP
Start: 2017-10-28 — End: ?
  Administered 2017-10-28 – 2017-10-31 (×12): 600 mg via ORAL

## 2017-10-28 MED ORDER — ELECTROLYTE-A IV SOLP
0 refills | Status: DC
Start: 2017-10-28 — End: 2017-10-28
  Administered 2017-10-28: 19:00:00 via INTRAVENOUS

## 2017-10-28 MED ORDER — ONDANSETRON HCL (PF) 4 MG/2 ML IJ SOLN
INTRAVENOUS | 0 refills | Status: DC
Start: 2017-10-28 — End: 2017-10-28
  Administered 2017-10-28: 19:00:00 4 mg via INTRAVENOUS

## 2017-10-28 MED ADMIN — FENTANYL CITRATE (PF) 50 MCG/ML IJ SOLN [3037]: 50 ug | INTRAVENOUS | @ 21:00:00 | Stop: 2017-10-28 | NDC 00409909412

## 2017-10-28 MED ADMIN — FENTANYL CITRATE (PF) 50 MCG/ML IJ SOLN [3037]: 50 ug | INTRAVENOUS | @ 20:00:00 | Stop: 2017-10-28 | NDC 00409909412

## 2017-10-29 LAB — COMPREHENSIVE METABOLIC PANEL
Lab: 0.3 mg/dL (ref 0.3–1.2)
Lab: 0.3 mg/dL — ABNORMAL LOW (ref 0.4–1.00)
Lab: 137 MMOL/L (ref 137–147)
Lab: 3.2 MMOL/L — ABNORMAL LOW (ref 3.5–5.1)
Lab: 3.4 g/dL — ABNORMAL LOW (ref 3.5–5.0)
Lab: 6.7 g/dL (ref 6.0–8.0)
Lab: 9 mg/dL (ref 8.5–10.6)
Lab: 98 mg/dL (ref 70–100)

## 2017-10-29 LAB — CBC: Lab: 7.7 10*3/uL — ABNORMAL LOW (ref 4.5–11.0)

## 2017-10-29 LAB — URIC ACID: Lab: 3 mg/dL (ref 2.0–7.0)

## 2017-10-29 LAB — LDH-LACTATE DEHYDROGENASE: Lab: 160 U/L (ref 100–210)

## 2017-10-29 MED ORDER — OXYCODONE 5 MG PO TAB
5-15 mg | ORAL | 0 refills | Status: DC | PRN
Start: 2017-10-29 — End: 2017-10-31
  Administered 2017-10-29 (×3): 5 mg via ORAL
  Administered 2017-10-29: 23:00:00 10 mg via ORAL
  Administered 2017-10-30: 14:00:00 5 mg via ORAL
  Administered 2017-10-30: 19:00:00 15 mg via ORAL
  Administered 2017-10-30: 15:00:00 10 mg via ORAL
  Administered 2017-10-31 (×4): 5 mg via ORAL

## 2017-10-30 MED ORDER — ACETAMINOPHEN 325 MG PO TAB
650 mg | ORAL | 0 refills | Status: DC | PRN
Start: 2017-10-30 — End: 2017-10-31
  Administered 2017-10-30 – 2017-10-31 (×5): 650 mg via ORAL

## 2017-10-31 ENCOUNTER — Encounter: Admit: 2017-10-31 | Discharge: 2017-10-31

## 2017-10-31 ENCOUNTER — Inpatient Hospital Stay: Admit: 2017-10-28 | Discharge: 2017-10-28

## 2017-10-31 DIAGNOSIS — N841 Polyp of cervix uteri: ICD-10-CM

## 2017-10-31 DIAGNOSIS — Z3A35 35 weeks gestation of pregnancy: ICD-10-CM

## 2017-10-31 DIAGNOSIS — Z8611 Personal history of tuberculosis: ICD-10-CM

## 2017-10-31 DIAGNOSIS — D649 Anemia, unspecified: ICD-10-CM

## 2017-10-31 DIAGNOSIS — O4413 Placenta previa with hemorrhage, third trimester: Principal | ICD-10-CM

## 2017-10-31 DIAGNOSIS — O1494 Unspecified pre-eclampsia, complicating childbirth: ICD-10-CM

## 2017-10-31 DIAGNOSIS — O3443 Maternal care for other abnormalities of cervix, third trimester: ICD-10-CM

## 2017-10-31 DIAGNOSIS — O34211 Maternal care for low transverse scar from previous cesarean delivery: ICD-10-CM

## 2017-10-31 DIAGNOSIS — Z87891 Personal history of nicotine dependence: ICD-10-CM

## 2017-10-31 DIAGNOSIS — O9902 Anemia complicating childbirth: ICD-10-CM

## 2017-10-31 MED ORDER — ACETAMINOPHEN 325 MG PO TAB
650 mg | ORAL_TABLET | ORAL | 0 refills | Status: AC | PRN
Start: 2017-10-31 — End: ?

## 2017-10-31 MED ORDER — OXYCODONE 5 MG PO TAB
5-15 mg | ORAL_TABLET | ORAL | 0 refills | 6.00000 days | Status: AC | PRN
Start: 2017-10-31 — End: 2017-12-06
  Filled 2017-10-31 (×2): qty 20, 1d supply, fill #1

## 2017-10-31 MED ORDER — DOCUSATE SODIUM 100 MG PO CAP
100 mg | ORAL_CAPSULE | Freq: Two times a day (BID) | ORAL | 3 refills | Status: AC
Start: 2017-10-31 — End: ?

## 2017-10-31 MED ORDER — IBUPROFEN 600 MG PO TAB
600 mg | ORAL_TABLET | ORAL | 0 refills | Status: AC
Start: 2017-10-31 — End: ?

## 2017-10-31 MED ORDER — SIMETHICONE 80 MG PO CHEW
80 mg | ORAL_TABLET | ORAL | 0 refills | Status: AC | PRN
Start: 2017-10-31 — End: ?

## 2017-10-31 MED ORDER — FAMOTIDINE 20 MG PO TAB
20 mg | ORAL_TABLET | Freq: Two times a day (BID) | ORAL | 3 refills | 90.00000 days | Status: AC | PRN
Start: 2017-10-31 — End: ?

## 2017-10-31 MED ORDER — ASCORBIC ACID (VITAMIN C) 500 MG PO TAB
500 mg | ORAL_TABLET | Freq: Every day | ORAL | 3 refills | 50.00000 days | Status: AC
Start: 2017-10-31 — End: ?

## 2017-10-31 MED ORDER — LANOLIN TP CREA
TOPICAL | 3 refills | Status: AC | PRN
Start: 2017-10-31 — End: 2017-12-06

## 2017-10-31 MED ORDER — FERROUS SULFATE 325 MG (65 MG IRON) PO TAB
325 mg | ORAL_TABLET | Freq: Every day | ORAL | 3 refills | Status: AC
Start: 2017-10-31 — End: ?

## 2017-11-01 ENCOUNTER — Encounter: Admit: 2017-11-01 | Discharge: 2017-11-01

## 2017-11-01 DIAGNOSIS — R87619 Unspecified abnormal cytological findings in specimens from cervix uteri: Principal | ICD-10-CM

## 2017-11-01 DIAGNOSIS — A150 Tuberculosis of lung: ICD-10-CM

## 2017-11-05 ENCOUNTER — Ambulatory Visit: Admit: 2017-11-05 | Discharge: 2017-11-06 | Payer: MEDICAID

## 2017-11-09 ENCOUNTER — Inpatient Hospital Stay
Admit: 2017-11-09 | Discharge: 2017-11-11 | Disposition: A | Discharge status: Home / Self Care | Payer: MEDICAID | Attending: Gynecologic Oncology

## 2017-11-09 ENCOUNTER — Encounter: Admit: 2017-11-09 | Discharge: 2017-11-09

## 2017-11-09 ENCOUNTER — Emergency Department: Admit: 2017-11-09 | Discharge: 2017-11-09 | Attending: Gynecologic Oncology

## 2017-11-09 DIAGNOSIS — O1490 Unspecified pre-eclampsia, unspecified trimester: ICD-10-CM

## 2017-11-09 DIAGNOSIS — Z8759 Personal history of other complications of pregnancy, childbirth and the puerperium: ICD-10-CM

## 2017-11-09 DIAGNOSIS — A150 Tuberculosis of lung: ICD-10-CM

## 2017-11-09 DIAGNOSIS — R87619 Unspecified abnormal cytological findings in specimens from cervix uteri: Principal | ICD-10-CM

## 2017-11-09 LAB — URINALYSIS MICROSCOPIC REFLEX TO CULTURE

## 2017-11-09 LAB — CBC AND DIFF
Lab: 0 10*3/uL (ref 0–0.20)
Lab: 0.1 10*3/uL (ref 0–0.45)
Lab: 5.8 K/UL (ref 4.5–11.0)
Lab: 78 FL — ABNORMAL LOW (ref 80–100)

## 2017-11-09 LAB — COMPREHENSIVE METABOLIC PANEL: Lab: 139 MMOL/L (ref 137–147)

## 2017-11-09 LAB — PROTIME INR (PT): Lab: 1.1 % — ABNORMAL LOW (ref 0.8–1.2)

## 2017-11-09 LAB — URINALYSIS DIPSTICK REFLEX TO CULTURE
Lab: NEGATIVE % (ref 0–5)
Lab: NEGATIVE % (ref 4–12)
Lab: NEGATIVE 10*3/uL (ref 0–0.80)
Lab: NEGATIVE K/UL (ref 1.8–7.0)

## 2017-11-09 LAB — PTT (APTT): Lab: 33 s (ref 24.0–36.5)

## 2017-11-09 LAB — LDH-LACTATE DEHYDROGENASE: Lab: 190 U/L — ABNORMAL LOW (ref 100–210)

## 2017-11-09 LAB — URIC ACID: Lab: 5.3 mg/dL — ABNORMAL LOW (ref 2.0–7.0)

## 2017-11-09 LAB — DIRECT EXAM (WET PREP)

## 2017-11-09 LAB — PROTEIN/CR RATIO,UR RAN: Lab: 12 mg/dL

## 2017-11-09 MED ORDER — GENTAMICIN IVPB (EXTENDED INTERVAL)
100 mg | INTRAVENOUS | 0 refills | Status: DC
Start: 2017-11-09 — End: 2017-11-11
  Administered 2017-11-10 (×6): 100 mg via INTRAVENOUS

## 2017-11-09 MED ORDER — MAGNESIUM SULFATE IN WATER 20 GRAM/500 ML (4 %) IV SOLP
1 g/h | INTRAVENOUS | 0 refills | Status: AC
Start: 2017-11-09 — End: ?
  Administered 2017-11-10: 03:00:00 1 g/h via INTRAVENOUS

## 2017-11-09 MED ORDER — IOHEXOL 350 MG IODINE/ML IV SOLN
70 mL | Freq: Once | INTRAVENOUS | 0 refills | Status: CP
Start: 2017-11-09 — End: ?
  Administered 2017-11-10: 06:00:00 70 mL via INTRAVENOUS

## 2017-11-09 MED ORDER — SODIUM CHLORIDE 0.9 % IJ SOLN
50 mL | Freq: Once | INTRAVENOUS | 0 refills | Status: CP
Start: 2017-11-09 — End: ?
  Administered 2017-11-10: 06:00:00 50 mL via INTRAVENOUS

## 2017-11-09 MED ORDER — AMPICILLIN 2G/100ML NS IVPB (MB+)
2 g | INTRAVENOUS | 0 refills | Status: DC
Start: 2017-11-09 — End: 2017-11-11
  Administered 2017-11-10 (×8): 2 g via INTRAVENOUS

## 2017-11-09 MED ORDER — DOCUSATE SODIUM 100 MG PO CAP
100 mg | Freq: Two times a day (BID) | ORAL | 0 refills | Status: DC
Start: 2017-11-09 — End: 2017-11-12
  Administered 2017-11-10 – 2017-11-11 (×4): 100 mg via ORAL

## 2017-11-09 MED ORDER — FERROUS SULFATE 325 MG (65 MG IRON) PO TAB
325 mg | Freq: Every day | ORAL | 0 refills | Status: DC
Start: 2017-11-09 — End: 2017-11-12
  Administered 2017-11-10 – 2017-11-11 (×3): 325 mg via ORAL

## 2017-11-09 MED ORDER — NIFEDIPINE 10 MG PO CAP
10 mg | Freq: Once | ORAL | 0 refills | Status: CP
Start: 2017-11-09 — End: ?

## 2017-11-09 MED ORDER — FAMOTIDINE 20 MG PO TAB
20 mg | Freq: Two times a day (BID) | ORAL | 0 refills | Status: DC
Start: 2017-11-09 — End: 2017-11-12
  Administered 2017-11-10 – 2017-11-11 (×3): 20 mg via ORAL

## 2017-11-09 MED ORDER — MORPHINE 4 MG/ML IV CRTG
4-8 mg | INTRAVENOUS | 0 refills | Status: DC | PRN
Start: 2017-11-09 — End: 2017-11-10

## 2017-11-09 MED ORDER — MAGNESIUM SULFATE IN WATER 4 GRAM/50 ML (8 %) IV PGBK
4 g | Freq: Once | INTRAVENOUS | 0 refills | Status: CP
Start: 2017-11-09 — End: ?
  Administered 2017-11-10: 02:00:00 4 g via INTRAVENOUS

## 2017-11-09 MED ORDER — CLINDAMYCIN IN 5 % DEXTROSE 900 MG/50 ML IV PGBK
900 mg | INTRAVENOUS | 0 refills | Status: DC
Start: 2017-11-09 — End: 2017-11-11
  Administered 2017-11-10 (×3): 900 mg via INTRAVENOUS

## 2017-11-09 MED ORDER — DIPHENHYDRAMINE HCL 50 MG/ML IJ SOLN
25 mg | INTRAVENOUS | 0 refills | Status: DC | PRN
Start: 2017-11-09 — End: 2017-11-12
  Administered 2017-11-10: 04:00:00 25 mg via INTRAVENOUS

## 2017-11-09 MED ORDER — LANOLIN TP CREA
TOPICAL | 0 refills | Status: DC | PRN
Start: 2017-11-09 — End: 2017-11-12

## 2017-11-09 MED ORDER — OXYCODONE 5 MG PO TAB
5-15 mg | ORAL | 0 refills | Status: DC | PRN
Start: 2017-11-09 — End: 2017-11-12
  Administered 2017-11-10 (×2): 5 mg via ORAL
  Administered 2017-11-11 (×2): 10 mg via ORAL

## 2017-11-09 MED ORDER — ONDANSETRON HCL (PF) 4 MG/2 ML IJ SOLN
4 mg | INTRAVENOUS | 0 refills | Status: DC | PRN
Start: 2017-11-09 — End: 2017-11-12
  Administered 2017-11-10: 04:00:00 4 mg via INTRAVENOUS

## 2017-11-09 MED ORDER — PRENATAL VIT-IRON FUM-FOLIC AC 28 MG IRON- 800 MCG PO TAB
1 | Freq: Every day | ORAL | 0 refills | Status: DC
Start: 2017-11-09 — End: 2017-11-12
  Administered 2017-11-10 – 2017-11-11 (×3): 1 via ORAL

## 2017-11-09 MED ORDER — IBUPROFEN 600 MG PO TAB
600 mg | ORAL | 0 refills | Status: DC
Start: 2017-11-09 — End: 2017-11-12
  Administered 2017-11-10 – 2017-11-11 (×7): 600 mg via ORAL

## 2017-11-09 MED ORDER — ACETAMINOPHEN 500 MG PO TAB
1000 mg | Freq: Once | ORAL | 0 refills | Status: CP
Start: 2017-11-09 — End: ?
  Administered 2017-11-10: 02:00:00 1000 mg via ORAL

## 2017-11-09 MED ORDER — GENTAMICIN PHARMACY TO MANAGE
1 | 0 refills | Status: DC
Start: 2017-11-09 — End: 2017-11-11

## 2017-11-09 MED ORDER — LORAZEPAM 2 MG/ML IJ SOLN
1 mg | Freq: Once | INTRAVENOUS | 0 refills | Status: CP
Start: 2017-11-09 — End: ?

## 2017-11-10 ENCOUNTER — Encounter: Admit: 2017-11-10 | Discharge: 2017-11-10

## 2017-11-10 LAB — COMPREHENSIVE METABOLIC PANEL
Lab: 0.4 mg/dL (ref 0.3–1.2)
Lab: 0.4 mg/dL (ref 0.4–1.00)
Lab: 10 mg/dL (ref 7–25)
Lab: 136 MMOL/L — ABNORMAL LOW (ref 137–147)
Lab: 22 MMOL/L (ref 21–30)
Lab: 26 U/L (ref 7–40)
Lab: 3 MMOL/L — ABNORMAL LOW (ref 3.5–5.1)
Lab: 30 U/L (ref 7–56)
Lab: 4.2 g/dL (ref 3.5–5.0)
Lab: 7.8 g/dL (ref 6.0–8.0)
Lab: 88 U/L (ref 25–110)
Lab: 9.1 mg/dL (ref 8.5–10.6)
Lab: >60 mL/min (ref >60)
Lab: 0.4 mg/dL (ref 0.3–1.2)
Lab: 0.4 mg/dL (ref 0.4–1.00)
Lab: 136 MMOL/L — ABNORMAL LOW (ref 137–147)
Lab: 24 MMOL/L (ref 21–30)
Lab: 24 U/L (ref 7–40)
Lab: 25 U/L (ref 7–56)
Lab: 3.8 g/dL (ref 3.5–5.0)
Lab: 7.2 g/dL (ref 6.0–8.0)
Lab: 78 mg/dL — ABNORMAL LOW (ref 70–100)
Lab: 8 mg/dL — ABNORMAL LOW (ref 7–25)
Lab: 8.8 mg/dL — ABNORMAL HIGH (ref 8.5–10.6)
Lab: 83 U/L — ABNORMAL LOW (ref 25–110)
Lab: 9 10*3/uL (ref 3–12)
Lab: >60 mL/min (ref >60)
Lab: >60 mL/min (ref >60)

## 2017-11-10 LAB — PHOSPHORUS: Lab: 3.3 mg/dL — ABNORMAL HIGH (ref 2.0–4.5)

## 2017-11-10 LAB — CBC AND DIFF
Lab: 0 10*3/uL (ref 0–0.20)
Lab: 0.2 10*3/uL (ref 0–0.45)
Lab: 7 10*3/uL (ref 4.5–11.0)

## 2017-11-10 LAB — URIC ACID: Lab: 5.7 mg/dL — ABNORMAL LOW (ref 2.0–7.0)

## 2017-11-10 LAB — LDH-LACTATE DEHYDROGENASE: Lab: 176 U/L — ABNORMAL LOW (ref 100–210)

## 2017-11-10 LAB — CHLAM/NG PCR SWAB: Lab: NEGATIVE

## 2017-11-10 LAB — MAGNESIUM: Lab: 3 mg/dL — ABNORMAL HIGH (ref 1.6–2.6)

## 2017-11-10 LAB — TROPONIN-I: Lab: 0 ng/mL (ref 0.0–0.05)

## 2017-11-10 MED ORDER — POTASSIUM CHLORIDE 20 MEQ PO TBTQ
40 meq | Freq: Once | ORAL | 0 refills | Status: CP
Start: 2017-11-10 — End: ?
  Administered 2017-11-10: 07:00:00 40 meq via ORAL

## 2017-11-10 MED ADMIN — LORAZEPAM 2 MG/ML IJ SOLN [10467]: 1 mg | INTRAVENOUS | @ 05:00:00 | Stop: 2017-11-10 | NDC 00641604401

## 2017-11-10 MED ADMIN — NIFEDIPINE 10 MG PO CAP [5558]: 10 mg | ORAL | @ 02:00:00 | Stop: 2017-11-10 | NDC 68084002211

## 2017-11-11 ENCOUNTER — Inpatient Hospital Stay: Admit: 2017-11-09 | Discharge: 2017-11-09

## 2017-11-11 DIAGNOSIS — O8612 Endometritis following delivery: ICD-10-CM

## 2017-11-11 DIAGNOSIS — O99345 Other mental disorders complicating the puerperium: ICD-10-CM

## 2017-11-11 DIAGNOSIS — O9081 Anemia of the puerperium: ICD-10-CM

## 2017-11-11 DIAGNOSIS — Z87891 Personal history of nicotine dependence: ICD-10-CM

## 2017-11-11 DIAGNOSIS — F419 Anxiety disorder, unspecified: ICD-10-CM

## 2017-11-11 DIAGNOSIS — O1415 Severe pre-eclampsia, complicating the puerperium: Principal | ICD-10-CM

## 2017-11-11 DIAGNOSIS — Z8611 Personal history of tuberculosis: ICD-10-CM

## 2017-11-12 ENCOUNTER — Encounter: Admit: 2017-11-12 | Discharge: 2017-11-12

## 2017-11-15 ENCOUNTER — Emergency Department: Admit: 2017-11-15 | Discharge: 2017-11-15 | Disposition: A | Discharge status: Home / Self Care

## 2017-11-15 ENCOUNTER — Encounter: Admit: 2017-11-15 | Discharge: 2017-11-15

## 2017-11-15 DIAGNOSIS — O9089 Other complications of the puerperium, not elsewhere classified: Principal | ICD-10-CM

## 2017-11-15 DIAGNOSIS — R51 Headache: ICD-10-CM

## 2017-11-15 DIAGNOSIS — Z87891 Personal history of nicotine dependence: ICD-10-CM

## 2017-11-15 DIAGNOSIS — O115 Pre-existing hypertension with pre-eclampsia, complicating the puerperium: ICD-10-CM

## 2017-11-15 LAB — COMPREHENSIVE METABOLIC PANEL
Lab: 0.2 mg/dL — ABNORMAL LOW (ref 0.3–1.2)
Lab: 0.4 mg/dL — ABNORMAL LOW (ref 0.4–1.00)
Lab: 105 MMOL/L — ABNORMAL LOW (ref 98–110)
Lab: 110 mg/dL — ABNORMAL HIGH (ref 70–100)
Lab: 13 mg/dL — ABNORMAL LOW (ref 7–25)
Lab: 140 MMOL/L (ref 137–147)
Lab: 3.3 MMOL/L — ABNORMAL LOW (ref 3.5–5.1)
Lab: 7.6 g/dL (ref 6.0–8.0)
Lab: 9 K/UL (ref 3–12)
Lab: 9.2 mg/dL — ABNORMAL HIGH (ref 8.5–10.6)
Lab: >60 mL/min (ref >60)
Lab: >60 mL/min (ref >60)

## 2017-11-15 LAB — URINALYSIS DIPSTICK

## 2017-11-15 LAB — LDH-LACTATE DEHYDROGENASE: Lab: 170 U/L (ref 100–210)

## 2017-11-15 LAB — CBC AND DIFF
Lab: 0 10*3/uL (ref 0–0.20)
Lab: 0.2 10*3/uL (ref 0–0.45)
Lab: 6.8 K/UL (ref 4.5–11.0)

## 2017-11-15 LAB — URINALYSIS, MICROSCOPIC

## 2017-11-15 MED ORDER — HYDRALAZINE 20 MG/ML IJ SOLN
10 mg | Freq: Once | INTRAVENOUS | 0 refills | Status: CP
Start: 2017-11-15 — End: ?
  Administered 2017-11-15: 21:00:00 10 mg via INTRAVENOUS

## 2017-11-15 MED ORDER — LACTATED RINGERS IV SOLP
1000 mL | Freq: Once | INTRAVENOUS | 0 refills | Status: CP
Start: 2017-11-15 — End: ?
  Administered 2017-11-15: 21:00:00 1000 mL via INTRAVENOUS

## 2017-11-15 MED ORDER — NIFEDIPINE 30 MG PO TR24
30 mg | ORAL_TABLET | Freq: Every day | ORAL | 3 refills | 30.00000 days | Status: AC
Start: 2017-11-15 — End: 2017-11-17

## 2017-11-15 MED ORDER — METOCLOPRAMIDE HCL 5 MG/ML IJ SOLN
10 mg | Freq: Once | INTRAVENOUS | 0 refills | Status: CP
Start: 2017-11-15 — End: ?
  Administered 2017-11-15: 21:00:00 10 mg via INTRAVENOUS

## 2017-11-15 MED ORDER — DIPHENHYDRAMINE HCL 50 MG/ML IJ SOLN
25 mg | Freq: Once | INTRAVENOUS | 0 refills | Status: CP
Start: 2017-11-15 — End: ?
  Administered 2017-11-15: 21:00:00 25 mg via INTRAVENOUS

## 2017-11-17 ENCOUNTER — Encounter: Admit: 2017-11-17 | Discharge: 2017-11-17

## 2017-11-17 ENCOUNTER — Emergency Department: Admit: 2017-11-17 | Discharge: 2017-11-17 | Disposition: A | Discharge status: Home / Self Care | Payer: MEDICAID

## 2017-11-17 ENCOUNTER — Emergency Department: Admit: 2017-11-17 | Discharge: 2017-11-17

## 2017-11-17 DIAGNOSIS — K047 Periapical abscess without sinus: ICD-10-CM

## 2017-11-17 DIAGNOSIS — A150 Tuberculosis of lung: ICD-10-CM

## 2017-11-17 DIAGNOSIS — R110 Nausea: ICD-10-CM

## 2017-11-17 DIAGNOSIS — I10 Essential (primary) hypertension: ICD-10-CM

## 2017-11-17 DIAGNOSIS — R000 Tachycardia, unspecified: ICD-10-CM

## 2017-11-17 DIAGNOSIS — R87619 Unspecified abnormal cytological findings in specimens from cervix uteri: Principal | ICD-10-CM

## 2017-11-17 DIAGNOSIS — G4440 Drug-induced headache, not elsewhere classified, not intractable: Principal | ICD-10-CM

## 2017-11-17 DIAGNOSIS — M542 Cervicalgia: ICD-10-CM

## 2017-11-17 LAB — URINALYSIS DIPSTICK POC
Lab: 1 (ref 1.003–1.035)
Lab: NEGATIVE
Lab: NEGATIVE
Lab: NEGATIVE
Lab: 7 (ref 5.0–8.0)
Lab: NEGATIVE
Lab: NEGATIVE M/UL (ref 4.0–5.0)

## 2017-11-17 LAB — COMPREHENSIVE METABOLIC PANEL
Lab: 0.4 mg/dL (ref 0.4–1.00)
Lab: 140 MMOL/L (ref 137–147)
Lab: 21 U/L (ref 7–40)
Lab: 3.5 MMOL/L — ABNORMAL LOW (ref 3.5–5.1)
Lab: 4.3 g/dL — ABNORMAL HIGH (ref 3.5–5.0)
Lab: 6 mg/dL — ABNORMAL LOW (ref 7–25)
Lab: 9.5 mg/dL — ABNORMAL HIGH (ref 8.5–10.6)
Lab: 98 mg/dL — ABNORMAL LOW (ref 70–100)

## 2017-11-17 LAB — CBC AND DIFF
Lab: 2 % — ABNORMAL HIGH (ref 0–5)
Lab: 34 % — ABNORMAL LOW (ref 36–45)
Lab: 6.7 10*3/uL (ref 4.5–11.0)

## 2017-11-17 MED ORDER — ACETAMINOPHEN 500 MG PO TAB
1000 mg | Freq: Once | ORAL | 0 refills | Status: CP
Start: 2017-11-17 — End: ?
  Administered 2017-11-17: 18:00:00 1000 mg via ORAL

## 2017-11-17 MED ORDER — METOPROLOL TARTRATE 25 MG PO TAB
12.5 mg | Freq: Two times a day (BID) | ORAL | 0 refills | Status: CN
Start: 2017-11-17 — End: ?

## 2017-11-17 MED ORDER — PROCHLORPERAZINE EDISYLATE 5 MG/ML IJ SOLN
10 mg | Freq: Once | INTRAVENOUS | 0 refills | Status: CP
Start: 2017-11-17 — End: ?
  Administered 2017-11-17: 16:00:00 10 mg via INTRAVENOUS

## 2017-11-17 MED ORDER — SODIUM CHLORIDE 0.9 % IJ SOLN
50 mL | Freq: Once | INTRAVENOUS | 0 refills | Status: CP
Start: 2017-11-17 — End: ?
  Administered 2017-11-17: 18:00:00 50 mL via INTRAVENOUS

## 2017-11-17 MED ORDER — HYDRALAZINE 20 MG/ML IJ SOLN
10 mg | Freq: Once | INTRAVENOUS | 0 refills | Status: DC
Start: 2017-11-17 — End: 2017-11-17

## 2017-11-17 MED ORDER — ACETAMINOPHEN 500 MG PO TAB
1000 mg | Freq: Once | ORAL | 0 refills | Status: DC
Start: 2017-11-17 — End: 2017-11-17

## 2017-11-17 MED ORDER — MAGNESIUM SULFATE IN D5W 1 GRAM/100 ML IV PGBK
1 g | Freq: Once | INTRAVENOUS | 0 refills | Status: CP
Start: 2017-11-17 — End: ?
  Administered 2017-11-17: 16:00:00 1 g via INTRAVENOUS

## 2017-11-17 MED ORDER — DIPHENHYDRAMINE HCL 25 MG PO CAP
25 mg | Freq: Once | ORAL | 0 refills | Status: CP
Start: 2017-11-17 — End: ?
  Administered 2017-11-17: 16:00:00 25 mg via ORAL

## 2017-11-17 MED ORDER — IOHEXOL 350 MG IODINE/ML IV SOLN
60 mL | Freq: Once | INTRAVENOUS | 0 refills | Status: CP
Start: 2017-11-17 — End: ?
  Administered 2017-11-17: 18:00:00 60 mL via INTRAVENOUS

## 2017-11-17 MED ORDER — MAGNESIUM SULFATE IN D5W 1 GRAM/100 ML IV PGBK
1 g | Freq: Once | INTRAVENOUS | 0 refills | Status: DC
Start: 2017-11-17 — End: 2017-11-17

## 2017-11-18 ENCOUNTER — Encounter: Admit: 2017-11-18 | Discharge: 2017-11-18

## 2017-11-18 DIAGNOSIS — I10 Essential (primary) hypertension: ICD-10-CM

## 2017-11-18 DIAGNOSIS — A150 Tuberculosis of lung: ICD-10-CM

## 2017-11-18 DIAGNOSIS — R87619 Unspecified abnormal cytological findings in specimens from cervix uteri: Principal | ICD-10-CM

## 2017-11-19 ENCOUNTER — Ambulatory Visit: Admit: 2017-11-19 | Discharge: 2017-11-20

## 2017-11-21 ENCOUNTER — Encounter: Admit: 2017-11-21 | Discharge: 2017-11-21

## 2017-11-22 ENCOUNTER — Encounter: Admit: 2017-11-22 | Discharge: 2017-11-22

## 2017-12-03 ENCOUNTER — Encounter: Admit: 2017-12-03 | Discharge: 2017-12-03

## 2017-12-06 ENCOUNTER — Ambulatory Visit: Admit: 2017-12-06 | Discharge: 2017-12-06

## 2017-12-06 ENCOUNTER — Encounter: Admit: 2017-12-06 | Discharge: 2017-12-06

## 2017-12-06 DIAGNOSIS — I10 Essential (primary) hypertension: ICD-10-CM

## 2017-12-06 DIAGNOSIS — R87619 Unspecified abnormal cytological findings in specimens from cervix uteri: Principal | ICD-10-CM

## 2017-12-06 DIAGNOSIS — A150 Tuberculosis of lung: ICD-10-CM

## 2017-12-06 MED ORDER — CITALOPRAM 20 MG PO TAB
20 mg | ORAL_TABLET | Freq: Every day | ORAL | 3 refills | Status: AC
Start: 2017-12-06 — End: ?

## 2017-12-06 MED ORDER — LABETALOL 200 MG PO TAB
200 mg | ORAL_TABLET | Freq: Two times a day (BID) | ORAL | 3 refills | 60.00000 days | Status: AC
Start: 2017-12-06 — End: 2018-01-10

## 2017-12-06 MED ORDER — NORETHINDRONE (CONTRACEPTIVE) 0.35 MG PO TAB
1 | ORAL_TABLET | Freq: Every day | ORAL | 3 refills | 45.00000 days | Status: AC
Start: 2017-12-06 — End: ?

## 2017-12-17 ENCOUNTER — Emergency Department: Admit: 2017-12-17 | Discharge: 2017-12-17 | Disposition: A | Discharge status: Home / Self Care

## 2017-12-17 ENCOUNTER — Encounter: Admit: 2017-12-17 | Discharge: 2017-12-17

## 2017-12-17 DIAGNOSIS — R079 Chest pain, unspecified: ICD-10-CM

## 2017-12-17 DIAGNOSIS — O99345 Other mental disorders complicating the puerperium: Principal | ICD-10-CM

## 2017-12-17 DIAGNOSIS — R87619 Unspecified abnormal cytological findings in specimens from cervix uteri: Principal | ICD-10-CM

## 2017-12-17 DIAGNOSIS — F321 Major depressive disorder, single episode, moderate: ICD-10-CM

## 2017-12-17 DIAGNOSIS — F329 Major depressive disorder, single episode, unspecified: ICD-10-CM

## 2017-12-17 DIAGNOSIS — I10 Essential (primary) hypertension: ICD-10-CM

## 2017-12-17 DIAGNOSIS — R0602 Shortness of breath: ICD-10-CM

## 2017-12-17 DIAGNOSIS — F53 Postpartum depression: ICD-10-CM

## 2017-12-17 DIAGNOSIS — R200 Anesthesia of skin: ICD-10-CM

## 2017-12-17 DIAGNOSIS — A150 Tuberculosis of lung: ICD-10-CM

## 2017-12-17 LAB — CBC AND DIFF
Lab: 11 g/dL — ABNORMAL LOW (ref 12.0–15.0)
Lab: 16 % — ABNORMAL HIGH (ref >60)
Lab: 24 pg — ABNORMAL LOW (ref 26–34)
Lab: 31 g/dL — ABNORMAL LOW (ref 32.0–36.0)
Lab: 34 % — ABNORMAL LOW (ref 36–45)
Lab: 341 K/UL (ref >60)
Lab: 4.5 M/UL (ref 4.0–5.0)
Lab: 7.2 K/UL (ref 4.5–11.0)
Lab: 75 FL — ABNORMAL LOW (ref 80–100)
Lab: 8.2 FL (ref 7–11)

## 2017-12-17 LAB — MAGNESIUM: Lab: 2 mg/dL (ref 1.6–2.6)

## 2017-12-17 LAB — BASIC METABOLIC PANEL: Lab: 136 MMOL/L — ABNORMAL LOW (ref 137–147)

## 2017-12-17 MED ORDER — CITALOPRAM 20 MG PO TAB
20 mg | ORAL_TABLET | Freq: Every day | ORAL | 0 refills | Status: AC
Start: 2017-12-17 — End: 2018-01-10

## 2018-01-10 ENCOUNTER — Ambulatory Visit: Admit: 2018-01-10 | Discharge: 2018-01-10

## 2018-01-10 ENCOUNTER — Encounter: Admit: 2018-01-10 | Discharge: 2018-01-10

## 2018-01-10 DIAGNOSIS — R87619 Unspecified abnormal cytological findings in specimens from cervix uteri: Principal | ICD-10-CM

## 2018-01-10 DIAGNOSIS — I10 Essential (primary) hypertension: Principal | ICD-10-CM

## 2018-01-10 DIAGNOSIS — A150 Tuberculosis of lung: ICD-10-CM

## 2018-01-10 DIAGNOSIS — O99345 Other mental disorders complicating the puerperium: ICD-10-CM

## 2018-01-10 MED ORDER — CITALOPRAM 20 MG PO TAB
20 mg | ORAL_TABLET | Freq: Every day | ORAL | 12 refills | Status: AC
Start: 2018-01-10 — End: ?

## 2018-01-10 MED ORDER — LABETALOL 200 MG PO TAB
200 mg | ORAL_TABLET | Freq: Two times a day (BID) | ORAL | 11 refills | 60.00000 days | Status: AC
Start: 2018-01-10 — End: ?

## 2018-01-20 ENCOUNTER — Encounter: Admit: 2018-01-20 | Discharge: 2018-01-20

## 2018-01-20 DIAGNOSIS — A150 Tuberculosis of lung: ICD-10-CM

## 2018-01-20 DIAGNOSIS — I10 Essential (primary) hypertension: ICD-10-CM

## 2018-01-20 DIAGNOSIS — R87619 Unspecified abnormal cytological findings in specimens from cervix uteri: Principal | ICD-10-CM

## 2021-06-07 ENCOUNTER — Encounter: Admit: 2021-06-07 | Discharge: 2021-06-07

## 2021-06-07 ENCOUNTER — Inpatient Hospital Stay: Admit: 2021-06-07 | Discharge: 2021-06-07

## 2021-06-07 ENCOUNTER — Emergency Department: Admit: 2021-06-07 | Discharge: 2021-06-07

## 2021-06-07 ENCOUNTER — Inpatient Hospital Stay: Admit: 2021-06-07

## 2021-06-07 DIAGNOSIS — R87619 Unspecified abnormal cytological findings in specimens from cervix uteri: Secondary | ICD-10-CM

## 2021-06-07 DIAGNOSIS — O034 Incomplete spontaneous abortion without complication: Secondary | ICD-10-CM

## 2021-06-07 DIAGNOSIS — A150 Tuberculosis of lung: Secondary | ICD-10-CM

## 2021-06-07 DIAGNOSIS — O039 Complete or unspecified spontaneous abortion without complication: Secondary | ICD-10-CM

## 2021-06-07 DIAGNOSIS — I10 Essential (primary) hypertension: Secondary | ICD-10-CM

## 2021-06-07 LAB — COMPREHENSIVE METABOLIC PANEL
ALBUMIN: 3.1 g/dL — ABNORMAL LOW (ref 3.5–5.0)
ALK PHOSPHATASE: 41 U/L (ref 25–110)
ALT: 9 U/L (ref 7–56)
ANION GAP: 10 (ref 3–12)
AST: 14 U/L (ref 7–40)
BLD UREA NITROGEN: 6 mg/dL — ABNORMAL LOW (ref 7–25)
CALCIUM: 7.4 mg/dL — ABNORMAL LOW (ref 8.5–10.6)
CHLORIDE: 107 MMOL/L (ref 98–110)
CO2: 18 MMOL/L — ABNORMAL LOW (ref 21–30)
CREATININE: 0.3 mg/dL — ABNORMAL LOW (ref 0.4–1.00)
EGFR: >60 mL/min (ref >60)
GLUCOSE,PANEL: 98 mg/dL (ref 70–100)
POTASSIUM: 4 MMOL/L (ref 3.5–5.1)
SODIUM: 135 MMOL/L — ABNORMAL LOW (ref 137–147)
TOTAL BILIRUBIN: 0.7 mg/dL (ref 0.3–1.2)
TOTAL PROTEIN: 5.9 g/dL — ABNORMAL LOW (ref 6.0–8.0)

## 2021-06-07 LAB — CBC
HEMATOCRIT: 26 % — ABNORMAL LOW (ref 36–45)
HEMOGLOBIN: 11 g/dL — ABNORMAL LOW (ref 12.0–15.0)
MCH: 21 pg — ABNORMAL LOW (ref 26–34)
MCH: 25 pg — ABNORMAL LOW (ref 26–34)
MCHC: 32 g/dL (ref 32.0–36.0)
MCV: 77 FL — ABNORMAL LOW (ref 80–100)
MCV: 80 FL — ABNORMAL LOW (ref 80–100)
MPV: 8.2 FL (ref 7–11)
MPV: 8.2 FL — ABNORMAL LOW (ref 7–11)
PLATELET COUNT: 200 K/UL (ref 150–400)
RDW: 25 % — ABNORMAL HIGH (ref 11–15)
RDW: 24 % — ABNORMAL HIGH (ref 11–15)
WBC COUNT: 12 K/UL — ABNORMAL HIGH (ref 4.5–11.0)
WBC COUNT: 16 K/UL — ABNORMAL HIGH (ref 4.5–11.0)
WBC COUNT: 20 K/UL — ABNORMAL HIGH (ref 4.5–11.0)

## 2021-06-07 LAB — HEMOGLOBIN & HEMATOCRIT, BG
HEMOGLOBIN BLOOD GAS: 8.9 g/dL — ABNORMAL LOW (ref 12.0–15.0)
HEMOGLOBIN BLOOD GAS: 10 g/dL — ABNORMAL LOW (ref 12.0–15.0)

## 2021-06-07 LAB — BLOOD GASES, ARTERIAL
BASE DEFICIT-ART: 8 MMOL/L — ABNORMAL LOW (ref 137–147)
BICARB, ART(CAL): 18 MMOL/L — ABNORMAL LOW (ref 21–28)
O2 SAT,ART(CALC.): 99 % — ABNORMAL HIGH (ref 95–99)
PH-ART: 7.3 % — ABNORMAL LOW (ref 7.35–7.45)
PH-ART: 7.2 % — ABNORMAL LOW (ref 7.35–7.45)
PO2-ART: 207 mmHg — ABNORMAL HIGH (ref 80–100)

## 2021-06-07 LAB — IONIZED CALCIUM,BG: IONIZED CALCIUM: 0.9 MMOL/L — ABNORMAL LOW (ref 1.0–1.3)

## 2021-06-07 LAB — LACTIC ACID (BG - RAPID LACTATE): LACTIC ACID(SYRINGE): 3.5 MMOL/L — ABNORMAL HIGH (ref 0.5–2.0)

## 2021-06-07 LAB — GLUCOSE,BG
GLUCOSE,PANEL: 127 mg/dL — ABNORMAL HIGH (ref 70–100)
GLUCOSE,PANEL: 155 mg/dL — ABNORMAL HIGH (ref 70–100)

## 2021-06-07 LAB — SODIUM,BG: SODIUM: 136 MMOL/L — ABNORMAL LOW (ref 137–147)

## 2021-06-07 LAB — BASIC METABOLIC PANEL
ANION GAP: 12 pg (ref 3–12)
BLD UREA NITROGEN: 6 mg/dL — ABNORMAL LOW (ref 7–25)
CHLORIDE: 109 MMOL/L — ABNORMAL LOW (ref 98–110)
CREATININE: 0.3 mg/dL — ABNORMAL LOW (ref 0.4–1.00)
EGFR: >60 mL/min (ref >60)
GLUCOSE,PANEL: 197 mg/dL — ABNORMAL HIGH (ref 70–100)
SODIUM: 136 MMOL/L — ABNORMAL LOW (ref 137–147)

## 2021-06-07 LAB — FIBRINOGEN
FIBRINOGEN: 234 mg/dL (ref 200–400)
FIBRINOGEN: 163 mg/dL — ABNORMAL LOW (ref 200–400)

## 2021-06-07 LAB — LACTIC ACID(LACTATE): LACTIC ACID: 3.4 MMOL/L — ABNORMAL HIGH (ref 0.5–2.0)

## 2021-06-07 LAB — PROTIME INR (PT)
INR: 1.1 mL/min (ref >60)
PROTIME: 12 s — ABNORMAL HIGH (ref 9.5–14.2)
PROTIME: 13 s — ABNORMAL LOW (ref 9.5–14.2)

## 2021-06-07 LAB — POTASSIUM, BG: POTASSIUM: 3.7 MMOL/L — ABNORMAL HIGH (ref 3.5–5.1)

## 2021-06-07 MED ORDER — ONDANSETRON 4 MG PO TBDI
4 mg | ORAL | 0 refills | Status: AC | PRN
Start: 2021-06-07 — End: ?

## 2021-06-07 MED ORDER — CEFAZOLIN INJ 1GM IVP
2 g | Freq: Once | INTRAVENOUS | 0 refills
Start: 2021-06-07 — End: ?

## 2021-06-07 MED ORDER — PHENYLEPHRINE HCL IN 0.9% NACL 1 MG/10 ML (100 MCG/ML) IV SYRG
INTRAVENOUS | 0 refills | Status: DC
Start: 2021-06-07 — End: 2021-06-08
  Administered 2021-06-07 (×9): 100 ug via INTRAVENOUS

## 2021-06-07 MED ORDER — PROPOFOL INJ 10 MG/ML IV VIAL
INTRAVENOUS | 0 refills | Status: DC
Start: 2021-06-07 — End: 2021-06-08
  Administered 2021-06-07: 23:00:00 120 mg via INTRAVENOUS

## 2021-06-07 MED ORDER — VASOPRESSIN 20 UNITS NS 100 ML (OR)
0 refills | Status: DC
Start: 2021-06-07 — End: 2021-06-08
  Administered 2021-06-07 (×2): 10 mL via INTRAMUSCULAR

## 2021-06-07 MED ORDER — TRANEXAMIC ACID IN NACL,ISO-OS 1,000 MG/100 ML (10 MG/ML) IV PGBK
INTRAVENOUS | 0 refills | Status: DC
Start: 2021-06-07 — End: 2021-06-08
  Administered 2021-06-07: 1 g via INTRAVENOUS

## 2021-06-07 MED ORDER — ROCURONIUM 10 MG/ML IV SOLN
INTRAVENOUS | 0 refills | Status: DC
Start: 2021-06-07 — End: 2021-06-08
  Administered 2021-06-08: 01:00:00 20 mg via INTRAVENOUS
  Administered 2021-06-08: 02:00:00 10 mg via INTRAVENOUS

## 2021-06-07 MED ORDER — METRONIDAZOLE IN NACL (ISO-OS) 500 MG/100 ML IV PGBK
INTRAVENOUS | 0 refills | Status: DC
Start: 2021-06-07 — End: 2021-06-08
  Administered 2021-06-07: 23:00:00 500 mg via INTRAVENOUS

## 2021-06-07 MED ORDER — NALOXONE 0.4 MG/ML IJ SOLN
.08 mg | INTRAVENOUS | 0 refills | Status: AC | PRN
Start: 2021-06-07 — End: ?

## 2021-06-07 MED ORDER — METHYLERGONOVINE 0.2 MG/ML (1 ML) IJ SOLN
INTRAMUSCULAR | 0 refills | Status: DC
Start: 2021-06-07 — End: 2021-06-08
  Administered 2021-06-07 – 2021-06-08 (×2): .2 mg via INTRAMUSCULAR

## 2021-06-07 MED ORDER — METRONIDAZOLE IN NACL (ISO-OS) 500 MG/100 ML IV PGBK
500 mg | Freq: Once | INTRAVENOUS | 0 refills
Start: 2021-06-07 — End: ?

## 2021-06-07 MED ORDER — ELECTROLYTE-A IV SOLP
INTRAVENOUS | 0 refills | Status: DC
Start: 2021-06-07 — End: 2021-06-08
  Administered 2021-06-07 (×2): via INTRAVENOUS

## 2021-06-07 MED ORDER — SUCCINYLCHOLINE CHLORIDE 20 MG/ML IJ SOLN
INTRAVENOUS | 0 refills | Status: DC
Start: 2021-06-07 — End: 2021-06-08
  Administered 2021-06-07: 23:00:00 100 mg via INTRAVENOUS

## 2021-06-07 MED ORDER — CEFAZOLIN 1 GRAM IJ SOLR
INTRAVENOUS | 0 refills | Status: DC
Start: 2021-06-07 — End: 2021-06-08
  Administered 2021-06-07: 23:00:00 2 g via INTRAVENOUS

## 2021-06-07 MED ORDER — FENTANYL CITRATE (PF) 50 MCG/ML IJ SOLN
25-50 ug | INTRAVENOUS | 0 refills | Status: DC | PRN
Start: 2021-06-07 — End: 2021-06-08
  Administered 2021-06-08 (×2): 25 ug via INTRAVENOUS

## 2021-06-07 MED ORDER — SCOPOLAMINE BASE 1 MG OVER 3 DAYS TD PT3D
1 | TRANSDERMAL | 0 refills | Status: AC
Start: 2021-06-07 — End: ?
  Administered 2021-06-07: 22:00:00 1 via TRANSDERMAL

## 2021-06-07 MED ORDER — CALCIUM GLUC IN NACL, ISO-OSM 1 GRAM/100 ML IV SOLN
1 g | Freq: Once | INTRAVENOUS | 0 refills | Status: CP
Start: 2021-06-07 — End: ?
  Administered 2021-06-08: 05:00:00 1 g via INTRAVENOUS

## 2021-06-07 MED ORDER — LACTATED RINGERS IV SOLP
1000 mL | INTRAVENOUS | 0 refills | Status: DC
Start: 2021-06-07 — End: 2021-06-08

## 2021-06-07 MED ORDER — MIDAZOLAM 1 MG/ML IJ SOLN
INTRAVENOUS | 0 refills | Status: DC
Start: 2021-06-07 — End: 2021-06-08
  Administered 2021-06-07: 23:00:00 2 mg via INTRAVENOUS

## 2021-06-07 MED ORDER — IOHEXOL 300 MG IODINE/ML IV SOLN
100 mL | Freq: Once | INTRA_ARTERIAL | 0 refills | Status: CP
Start: 2021-06-07 — End: ?
  Administered 2021-06-08: 03:00:00 100 mL via INTRA_ARTERIAL

## 2021-06-07 MED ORDER — ONDANSETRON HCL (PF) 4 MG/2 ML IJ SOLN
4 mg | INTRAVENOUS | 0 refills | Status: AC | PRN
Start: 2021-06-07 — End: ?
  Administered 2021-06-08: 03:00:00 4 mg via INTRAVENOUS

## 2021-06-07 MED ORDER — HYDROMORPHONE PCA 10 MG/50 ML SYR (STD CONC)(ADULT)(PREMADE)
INTRAVENOUS | 0 refills | Status: AC
Start: 2021-06-07 — End: ?
  Administered 2021-06-08: 05:00:00 50.000 mL via INTRAVENOUS

## 2021-06-07 MED ORDER — ARTIFICIAL TEARS (PF) SINGLE DOSE DROPS GROUP
OPHTHALMIC | 0 refills | Status: DC
Start: 2021-06-07 — End: 2021-06-08
  Administered 2021-06-07: 23:00:00 2 [drp] via OPHTHALMIC

## 2021-06-07 MED ORDER — DEXAMETHASONE SODIUM PHOSPHATE 4 MG/ML IJ SOLN
INTRAVENOUS | 0 refills | Status: DC
Start: 2021-06-07 — End: 2021-06-08
  Administered 2021-06-08: 01:00:00 4 mg via INTRAVENOUS

## 2021-06-07 MED ORDER — PROCHLORPERAZINE EDISYLATE 5 MG/ML IJ SOLN
10 mg | Freq: Once | INTRAVENOUS | 0 refills | Status: AC
Start: 2021-06-07 — End: ?

## 2021-06-07 MED ORDER — CARBOPROST TROMETHAMINE 250 MCG/ML IM SOLN
INTRAMUSCULAR | 0 refills | Status: DC
Start: 2021-06-07 — End: 2021-06-08
  Administered 2021-06-07 – 2021-06-08 (×2): 250 ug via INTRAMUSCULAR

## 2021-06-07 MED ORDER — SODIUM CHLORIDE 0.9 % IV SOLP
INTRAVENOUS | 0 refills | Status: DC
Start: 2021-06-07 — End: 2021-06-08
  Administered 2021-06-08: via INTRAVENOUS

## 2021-06-07 MED ORDER — OXYCODONE 5 MG PO TAB
5-10 mg | Freq: Once | ORAL | 0 refills | Status: DC | PRN
Start: 2021-06-07 — End: 2021-06-08

## 2021-06-07 MED ORDER — OXYCODONE 5 MG PO TAB
5-10 mg | ORAL | 0 refills | Status: DC | PRN
Start: 2021-06-07 — End: 2021-06-08
  Administered 2021-06-08: 04:00:00 10 mg via ORAL

## 2021-06-07 MED ORDER — ONDANSETRON HCL (PF) 4 MG/2 ML IJ SOLN
INTRAVENOUS | 0 refills | Status: DC
Start: 2021-06-07 — End: 2021-06-08
  Administered 2021-06-08: 01:00:00 4 mg via INTRAVENOUS

## 2021-06-07 MED ORDER — POLYETHYLENE GLYCOL 3350 17 GRAM PO PWPK
1 | Freq: Every day | ORAL | 0 refills | Status: AC | PRN
Start: 2021-06-07 — End: ?

## 2021-06-07 MED ORDER — PATCH DOCUMENTATION - SCOPOLAMINE BASE 1 MG/72HR
1 | Freq: Two times a day (BID) | TRANSDERMAL | 0 refills | Status: AC
Start: 2021-06-07 — End: ?

## 2021-06-07 MED ORDER — FENTANYL CITRATE (PF) 50 MCG/ML IJ SOLN
INTRAVENOUS | 0 refills | Status: DC
Start: 2021-06-07 — End: 2021-06-08
  Administered 2021-06-07 (×2): 50 ug via INTRAVENOUS
  Administered 2021-06-08 (×2): 25 ug via INTRAVENOUS
  Administered 2021-06-08: 01:00:00 50 ug via INTRAVENOUS

## 2021-06-07 MED ORDER — SENNOSIDES-DOCUSATE SODIUM 8.6-50 MG PO TAB
1 | Freq: Every day | ORAL | 0 refills | Status: AC | PRN
Start: 2021-06-07 — End: ?
  Administered 2021-06-09: 03:00:00 1 via ORAL

## 2021-06-07 MED ORDER — ACETAMINOPHEN 500 MG PO TAB
1000 mg | Freq: Once | ORAL | 0 refills | Status: DC | PRN
Start: 2021-06-07 — End: 2021-06-08

## 2021-06-07 MED ORDER — MISOPROSTOL 200 MCG PO TAB
0 refills | Status: DC
Start: 2021-06-07 — End: 2021-06-08
  Administered 2021-06-08: 1000 ug via RECTAL

## 2021-06-07 MED ORDER — ACETAMINOPHEN 325 MG PO TAB
650 mg | ORAL | 0 refills | Status: AC | PRN
Start: 2021-06-07 — End: ?
  Administered 2021-06-08: 04:00:00 650 mg via ORAL

## 2021-06-07 MED ORDER — FAMOTIDINE (PF) 20 MG/2 ML IV SOLN
INTRAVENOUS | 0 refills | Status: DC
Start: 2021-06-07 — End: 2021-06-08
  Administered 2021-06-08: 01:00:00 20 mg via INTRAVENOUS

## 2021-06-07 MED ORDER — MELATONIN 5 MG PO TAB
5 mg | Freq: Every evening | ORAL | 0 refills | Status: AC | PRN
Start: 2021-06-07 — End: ?
  Administered 2021-06-09: 06:00:00 5 mg via ORAL

## 2021-06-07 MED ORDER — FENTANYL CITRATE (PF) 50 MCG/ML IJ SOLN
25-50 ug | INTRAVENOUS | 0 refills | Status: DC | PRN
Start: 2021-06-07 — End: 2021-06-08

## 2021-06-07 MED ORDER — SUGAMMADEX 100 MG/ML IV SOLN
INTRAVENOUS | 0 refills | Status: DC
Start: 2021-06-07 — End: 2021-06-08
  Administered 2021-06-08: 02:00:00 150 mg via INTRAVENOUS

## 2021-06-07 MED ORDER — PROPOFOL 10 MG/ML IV EMUL 100 ML (INFUSION)(AM)(OR)
INTRAVENOUS | 0 refills | Status: DC
Start: 2021-06-07 — End: 2021-06-08
  Administered 2021-06-08: 01:00:00 80 ug/kg/min via INTRAVENOUS

## 2021-06-07 MED ORDER — KETAMINE 10 MG/ML IJ SOLN
INTRAVENOUS | 0 refills | Status: DC
Start: 2021-06-07 — End: 2021-06-08
  Administered 2021-06-08: 20 mg via INTRAVENOUS

## 2021-06-07 MED ORDER — LIDOCAINE (PF) 200 MG/10 ML (2 %) IJ SYRG
INTRAVENOUS | 0 refills | Status: DC
Start: 2021-06-07 — End: 2021-06-08
  Administered 2021-06-07: 23:00:00 40 mg via INTRAVENOUS

## 2021-06-07 MED ORDER — HALOPERIDOL LACTATE 5 MG/ML IJ SOLN
1 mg | Freq: Once | INTRAVENOUS | 0 refills | Status: DC | PRN
Start: 2021-06-07 — End: 2021-06-08

## 2021-06-07 MED ORDER — DIPHENHYDRAMINE HCL 50 MG/ML IJ SOLN
25 mg | Freq: Once | INTRAVENOUS | 0 refills | Status: DC | PRN
Start: 2021-06-07 — End: 2021-06-08

## 2021-06-07 MED ADMIN — LACTATED RINGERS IV SOLP [4318]: 1000 mL | INTRAVENOUS | @ 22:00:00 | Stop: 2021-06-07 | NDC 00338011704

## 2021-06-07 NOTE — H&P (View-Only)
Gynecology Surgical Admit  History and Physical Examination      Stephanie Cervantes  Admission Date: 06/07/21                      Assessment:   45 y.o. [id number] with inevitable incomplete abortion, POC noted in cervix  Active bleeding on examination with severe anemia on presentation    Plan:  -To OR for suction dilation and curettage, other indicated procedures  -Risks of the procedure were reviewed including bleeding, infection, allergic reaction, repeat procedures, and damage to surrounding structures. Discussed risk of hysterectomy in case of life threatening hemorrhage not responsive to conservative measures. She is also aware of the potential need for blood transfusion and risks associated with blood, including HIV, Hepatitis. Patient understands these risks and wishes to proceed with the above procedure.     Discussed with Dr. Apolonio Schneiders, MD  PGY-4 Obstetrics and Gynecology    Please call the Gynecology pager [phone] with any questions or concerns. Thank you.  ______________________________________________________________________________    History of Present Illness:   Stephanie Cervantes is a 19 y.o. [phone] with active bleeding from cervix, tissue noted in cervix at [facility] with gestational sac in cervical canal. She reports heavy bleeding for several days. LMP 6/15. Thought she couldn't get pregnant due to her age. Had 2U pRBC at [facility] prior to presentation due to Hb 5.9    Patient presenting for surgery today. No complaints or concerns. No change in her medical history      Review of Systems:  Otherwise negative except for as mentioned in HPI.      OB History:   OB History   Gravida Para Term Preterm AB Living   9 8 5 3  0 7   SAB IAB Ectopic Multiple Live Births   0 0 0   7      # Outcome Date GA Lbr Len/2nd Weight Sex Delivery Anes PTL Lv   9 Current            8 Preterm 10/28/17 [redacted]w[redacted]d  2699 g (5 lb 15.2 oz) F CS-LTranv Gen N LIV      Complications: Other Excessive Bleeding   7 Preterm 10/15/14 [redacted]w[redacted]d 2450 g (5 lb 6.4 oz) F Vag-Spont CSE, EPI Y LIV   6 Term 2013 [redacted]w[redacted]d   M VBAC  N LIV   5 Term 2012 [redacted]w[redacted]d   M VBAC   LIV   4 Preterm 2007 [redacted]w[redacted]d   M VBAC  Y Fetal Demise   3 Term 2004 [redacted]w[redacted]d   F VBAC  N LIV   2 Term 2000 [redacted]w[redacted]d   F CS-LTranv  N LIV      Complications: Failure to Progress in First Stage   1 Term 1996 [redacted]w[redacted]d   F Spontaneous  N LIV       PmHx:   Medical History:   Diagnosis Date   ? Abnormal Pap smear of cervix    ? Hypertension    ? TB (pulmonary tuberculosis) 2005    Rx in [place] about 2005.  ID notes (11/26/14; 01/03/15; 03/06/15) review records from [place] and compare CTs from [place] and 11/27/14 at [facility] showing either slight improvement or no change. Four AFB sputupm cx were neg (at [facility] on2/9/16; 11/30/14;12/03/14;12/04/14).  1 of 4 sputum Cx in [place] in 2013-2014 were pos for MAC. On 03/06/15 ID concludes: no active TB or MAC.  Entered by Manya Silvas, MD   ? TB (pulmonary tuberculosis) 2004       PsHx:   Surgical History:   Procedure Laterality Date   ? CESAREAN DELIVERY N/A 10/28/2017    Performed by Tawny Hopping, MD at [facility] OR   ? CESAREAN SECTION      2000       FmHx:   Family History   Problem Relation Age of Onset   ? Heart Attack Mother    ? Cancer-Breast Neg Hx    ? Cervical Cancer Neg Hx    ? Cancer-Colon Neg Hx    ? Cancer-Ovarian Neg Hx    ? Cancer-Uterine Neg Hx        Social:   Social History     Socioeconomic History   ? Marital status: Single   Tobacco Use   ? Smoking status: Former Smoker   ? Smokeless tobacco: Never Used   Substance and Sexual Activity   ? Alcohol use: No     Alcohol/week: 0.0 standard drinks   ? Drug use: No   ? Sexual activity: Not Currently   Social History Narrative    ** Merged History Encounter **            Allergies:    Patient has no known allergies.    Medications:  No current facility-administered medications on file prior to encounter.     Current Outpatient Medications on File Prior to Encounter   Medication Sig Dispense Refill   ? acetaminophen (TYLENOL) 325 mg tablet Take two tablets by mouth every 4 hours as needed. 30 tablet 0   ? ascorbic acid (VITAMIN C) 500 mg tablet Take one tablet by mouth daily. 90 tablet 3   ? citalopram (CELEXA) 20 mg tablet Take one tablet by mouth daily. 30 tablet 12   ? citalopram (CELEXA) 20 mg tablet Take one tablet by mouth daily. 30 tablet 3   ? docusate (COLACE) 100 mg capsule Take one capsule by mouth twice daily. 180 capsule 3   ? famotidine (PEPCID) 20 mg tablet Take one tablet by mouth twice daily as needed. 180 tablet 3   ? ferrous sulfate (FEOSOL, FEROSUL) 325 mg (65 mg iron) tablet Take one tablet by mouth daily. Take on an empty stomach at least 1 hour before or 2 hours after food. 90 tablet 3   ? ibuprofen (MOTRIN) 600 mg tablet Take one tablet by mouth every 6 hours. Take with food. 30 tablet 0   ? labetalol (NORMODYNE) 200 mg tablet Take one tablet by mouth twice daily. 60 tablet 11   ? norethindrone (contraceptive) (MICRONOR) 0.35 mg tablet Take one tablet by mouth daily. 84 tablet 3   ? simethicone (MYLICON) 80 mg chew tablet Chew one tablet by mouth every 6 hours as needed for Flatulence. 30 tablet 0   ? vitamins, prenatal w/iron & folate 65/1 mg tab Take 1 tablet by mouth daily.         Physical Exam:   Vitals:    06/07/21 1530 06/07/21 1555 06/07/21 1556 06/07/21 1652   BP: 109/60  111/58 (!) 142/74   BP Source:    Arm, Left Upper   Pulse: 78 77 79 83   Temp:    36.9 ?C (98.4 ?F)   SpO2: 100% 100%  100%   O2 Device:    None (Room air)   Weight:           Physical Exam   Constitutional:  She is oriented to person, place, and time. She appears well-developed and well-nourished.   Head: Normocephalic.   Neck: Normal range of motion.   Cardiovascular: Normal rate.    Pulmonary/Chest: Effort normal.   Musculoskeletal: Normal range of motion.   Neurological: She is alert and oriented to person, place, and time.   Psychiatric: She has a normal mood and affect.     SSE: inflammation +/- fibrin deposition on external cervix, bulbous tissue noted just inside os with slow, active bleeding. Small amount of tissue removed with ring forceps  SVE: 2 cm dilated    Lab/Radiology/Other Diagnostic Tests:    24-hour labs:  No results found for this visit on 06/07/21 (from the past 24 hour(s)).

## 2021-06-07 NOTE — ED Notes
Dr. Betti Cruz paged to notify of patient arrival to ED.

## 2021-06-07 NOTE — Progress Notes
10th pregnancy   [redacted] weeks pregnant with heartbeat   Low implantation   Transfer d/t significant hemorrhage   120 tachy   Hgb 5.9  OB at sending evaluating pt (if baby is headed out we may not need to transfer)   Low in cervix lots of bleeding   Fluids and blood administered     **waiting to hear back from OB after exam-direct line provided

## 2021-06-08 ENCOUNTER — Encounter: Admit: 2021-06-08 | Discharge: 2021-06-08

## 2021-06-08 DIAGNOSIS — R87619 Unspecified abnormal cytological findings in specimens from cervix uteri: Secondary | ICD-10-CM

## 2021-06-08 DIAGNOSIS — I10 Essential (primary) hypertension: Secondary | ICD-10-CM

## 2021-06-08 DIAGNOSIS — A150 Tuberculosis of lung: Secondary | ICD-10-CM

## 2021-06-08 MED ADMIN — METHOCARBAMOL 750 MG PO TAB [4972]: 750 mg | ORAL | @ 13:00:00 | NDC 00904705861

## 2021-06-08 MED ADMIN — FENTANYL CITRATE (PF) 50 MCG/ML IJ SOLN [3037]: 50 ug | INTRAVENOUS | @ 04:00:00 | Stop: 2021-06-08 | NDC 00409909412

## 2021-06-08 MED ADMIN — OXYCODONE 5 MG PO TAB [10814]: 5 mg | ORAL | @ 19:00:00 | NDC 00904696661

## 2021-06-08 MED ADMIN — KETOROLAC 15 MG/ML IJ SOLN [22472]: 15 mg | INTRAVENOUS | @ 19:00:00 | Stop: 2021-06-13 | NDC 72611071901

## 2021-06-08 MED ADMIN — FENTANYL CITRATE (PF) 50 MCG/ML IJ SOLN [3037]: 50 ug | INTRAVENOUS | @ 03:00:00 | Stop: 2021-06-08 | NDC 00409909412

## 2021-06-08 MED ADMIN — ACETAMINOPHEN 500 MG PO TAB [102]: 1000 mg | ORAL | @ 10:00:00 | NDC 00904673061

## 2021-06-08 MED ADMIN — ACETAMINOPHEN 500 MG PO TAB [102]: 1000 mg | ORAL | @ 18:00:00 | NDC 00904673080

## 2021-06-08 MED ADMIN — MAGNESIUM SULFATE IN D5W 1 GRAM/100 ML IV PGBK [166578]: 1 g | INTRAVENOUS | @ 17:00:00 | Stop: 2021-06-08 | NDC 00338170940

## 2021-06-08 MED ADMIN — MAGNESIUM SULFATE IN D5W 1 GRAM/100 ML IV PGBK [166578]: 1 g | INTRAVENOUS | @ 13:00:00 | Stop: 2021-06-08 | NDC 00338170940

## 2021-06-08 MED ADMIN — ACETAMINOPHEN 500 MG PO TAB [102]: 1000 mg | ORAL | @ 23:00:00 | NDC 00904673080

## 2021-06-08 NOTE — Progress Notes
Patient evaluated in the operating room with ultrasound.  Patient  noted to have intermittent active bleeding from the cervix during the exam under anesthesia.  Per ultrasound the pregnancy was noted to be in the cervical canal.  Positive fetal heart tones were noted at that time.    The patient had large amount of blood loss with vital sign instability at an outside facility and required 2 units of blood transfusion at that hospital.  On arrival to [place] she continued to have low blood pressures with a hemoglobin of 7 and another 2 units were given in the preop area prior to surgery.  Given the extreme amount of bleeding and risk to mother's life in addition to an inevitable abortion noted during the exam under anesthesia decision was made to perform a suction D&C.  Patient also consented for removal of pregnancy tissue prior to the surgery understood the risks of procedure including bleeding risk of hysterectomy for life-threatening hemorrhage.  [name], MD

## 2021-06-08 NOTE — Anesthesia Post-Procedure Evaluation
Post-Anesthesia Evaluation    Name: Stephanie Cervantes      MRN: [id number]     DOB: 12-05-1975     Age: 45 y.o.     Sex: female   __________________________________________________________________________     Procedure Information     Anesthesia Start Date/Time: 06/07/21 1755    Procedure: SUCTION DILATION AND CURETTAGE (N/A )    Location: MAIN OR 11 / Main OR/Periop    Surgeons: [name], MD          Post-Anesthesia Vitals  ABP: 150/86 (08/20 2150)   Vitals Value Taken Time   BP     Temp 36.7    Pulse 95 06/07/21 2158   Respirations 19 PER MINUTE 06/07/21 2158   SpO2 100 % 06/07/21 2158   O2 Device     ABP 150/86 06/07/21 2150   ART BP     Vitals shown include unvalidated device data.      Post Anesthesia Evaluation Note    Evaluation location: ICU  Patient participation: recovered; patient unable to participate at baseline (Unable to fully assess orientation due to language barrier, but appears oriented with limited spanish known by provider)  Level of consciousness: alert    Pain score: Pain scale: Pt denies when asked.  Pain management: adequate    Hydration: normovolemia  Temperature: 36.0C - 38.4C  Airway patency: adequate    Perioperative Events       Post-op nausea and vomiting: nausea; vomiting (Meds given in ICU & scop patch in place.)    Postoperative Status  Cardiovascular status: hemodynamically stable  Respiratory status: spontaneous ventilation and supplemental oxygen  Follow-up needed: none  ICU Information    Blood Products Given-yes  PRBC units given: 4            NSICU information      no ICP monitor used    no anticonvulsants were given  Staff involved in transport include: CRNA, anesthesiologist, OR nurse and an[facility] resident      Perioperative Events  Encounter Complications   Complication Outcome Phase Comment   Unplanned ICU admit  Intraprocedure Received multiple blood products and significant blood loss

## 2021-06-08 NOTE — Other
Immediate Post Procedure Note    Date:  06/07/2021                                         Attending Physician:   Dr. Felipa Furnace  Performing Provider:  Anne Ng. Mesiah Manzo, MD    Consent:  Consent obtained from patients husband via spanish interpreter .  Time out performed: Consent obtained, correct patient verified, correct procedure verified, correct site verified, patient marked as necessary.  Pre/Post Procedure Diagnosis:  Uterine bleed  Indications:  S/p D/C with uncontrolled bleeding      Procedure(s):  Pelvic angiogram with uterine artery gel foam embolization   Findings:  Access in right common femoral artery. Successful gel foam embolization of the bilateral uterine arteries with significant decrease in flow post intervention. Access site successfully closed with angioseal closure device.      Estimated Blood Loss:  Minimal  Specimen(s) Removed/Disposition:  None  Complications: None  Patient Tolerated Procedure: Well  Post-Procedure Condition:  stable    Stephanie Cervantes L. Camry Theiss, MD

## 2021-06-08 NOTE — Progress Notes
@   1930:  Pt transported by cart from OR w/ anesthesia staff, to [facility].    @ 2015:  Ipad spanish interpreter # [phone] used to explain IR procedure to pt's husband to obtain consent.     @ 2150:  Pt transported by cart from [facility] 4 to SICU w/ anesthesia staff & IR RN.  Pt's R groin site is cdi/no hematoma noted.  Report given to SICU RN.  Pt's right leg to remain flat/straight until 2330.

## 2021-06-09 ENCOUNTER — Encounter: Admit: 2021-06-09 | Discharge: 2021-06-09

## 2021-06-09 DIAGNOSIS — R87619 Unspecified abnormal cytological findings in specimens from cervix uteri: Secondary | ICD-10-CM

## 2021-06-09 DIAGNOSIS — I10 Essential (primary) hypertension: Secondary | ICD-10-CM

## 2021-06-09 DIAGNOSIS — A150 Tuberculosis of lung: Secondary | ICD-10-CM

## 2021-06-09 MED ADMIN — ACETAMINOPHEN 500 MG PO TAB [102]: 1000 mg | ORAL | @ 13:00:00 | Stop: 2021-06-09 | NDC 00904673061

## 2021-06-09 MED ADMIN — ACETAMINOPHEN 500 MG PO TAB [102]: 1000 mg | ORAL | @ 05:00:00 | Stop: 2021-06-09 | NDC 00904673061

## 2021-06-09 MED ADMIN — METHOCARBAMOL 750 MG PO TAB [4972]: 750 mg | ORAL | @ 03:00:00 | NDC 00904705861

## 2021-06-09 MED ADMIN — METHOCARBAMOL 750 MG PO TAB [4972]: 750 mg | ORAL | @ 13:00:00 | Stop: 2021-06-09 | NDC 00904705861

## 2021-06-09 MED ADMIN — KETOROLAC 15 MG/ML IJ SOLN [22472]: 15 mg | INTRAVENOUS | @ 03:00:00 | Stop: 2021-06-13 | NDC 72611071901

## 2021-06-09 NOTE — Telephone Encounter
Rocky Crafts, LPN  [facility] Nurse Team 1  Patient has been scheduled this will appear on her discharge summary.   Future Appointments   07/07/2021 1:00 PM  Cyndia Bent., * [facility]      OB/GYN            Previous Messages      ----- Message -----   From: Judeth Porch   Sent: 06/09/2021  8:11 AM CDT   To: Obgyn Nurse Team 1   Subject: Discharge pending - Dr. Gillermina Phy - 4 week *

## 2021-06-12 ENCOUNTER — Encounter: Admit: 2021-06-12 | Discharge: 2021-06-12

## 2021-06-16 ENCOUNTER — Encounter: Admit: 2021-06-16 | Discharge: 2021-06-16

## 2021-07-09 ENCOUNTER — Encounter: Admit: 2021-07-09 | Discharge: 2021-07-09

## 2022-09-14 ENCOUNTER — Encounter: Admit: 2022-09-14 | Discharge: 2022-09-14
